# Patient Record
Sex: Female | Born: 1935 | Race: Black or African American | Hispanic: No | Smoking: Never smoker
Health system: Southern US, Community
[De-identification: ages and names within clinical notes are randomized; demographics above are authoritative.]

## PROBLEM LIST (undated history)

## (undated) DIAGNOSIS — H8110 Benign paroxysmal vertigo, unspecified ear: Secondary | ICD-10-CM

## (undated) DIAGNOSIS — R413 Other amnesia: Secondary | ICD-10-CM

## (undated) DIAGNOSIS — F0280 Dementia in other diseases classified elsewhere without behavioral disturbance: Secondary | ICD-10-CM

## (undated) DIAGNOSIS — G309 Alzheimer's disease, unspecified: Secondary | ICD-10-CM

## (undated) DIAGNOSIS — R739 Hyperglycemia, unspecified: Secondary | ICD-10-CM

## (undated) DIAGNOSIS — M1990 Unspecified osteoarthritis, unspecified site: Secondary | ICD-10-CM

## (undated) DIAGNOSIS — K5900 Constipation, unspecified: Secondary | ICD-10-CM

## (undated) DIAGNOSIS — H409 Unspecified glaucoma: Secondary | ICD-10-CM

## (undated) DIAGNOSIS — M159 Polyosteoarthritis, unspecified: Secondary | ICD-10-CM

## (undated) DIAGNOSIS — J309 Allergic rhinitis, unspecified: Secondary | ICD-10-CM

## (undated) HISTORY — DX: Unspecified osteoarthritis, unspecified site: M19.90

## (undated) HISTORY — DX: Dementia in other diseases classified elsewhere without behavioral disturbance: F02.80

## (undated) HISTORY — DX: Polyosteoarthritis, unspecified: M15.9

## (undated) HISTORY — DX: Constipation, unspecified: K59.00

## (undated) HISTORY — DX: Alzheimer's disease, unspecified: G30.9

## (undated) HISTORY — PX: EYE SURGERY: SHX253

## (undated) HISTORY — DX: Other amnesia: R41.3

## (undated) HISTORY — DX: Allergic rhinitis, unspecified: J30.9

## (undated) HISTORY — PX: TONSILECTOMY/ADENOIDECTOMY WITH MYRINGOTOMY: SHX6125

## (undated) HISTORY — DX: Hyperglycemia, unspecified: R73.9

## (undated) HISTORY — DX: Benign paroxysmal vertigo, unspecified ear: H81.10

## (undated) HISTORY — DX: Unspecified glaucoma: H40.9

---

## 1961-04-29 HISTORY — PX: AMPUTATION FINGER / THUMB: SUR24

## 2002-02-14 ENCOUNTER — Emergency Department (HOSPITAL_COMMUNITY): Admission: EM | Admit: 2002-02-14 | Discharge: 2002-02-14 | Payer: Self-pay | Admitting: Emergency Medicine

## 2011-04-19 ENCOUNTER — Ambulatory Visit (INDEPENDENT_AMBULATORY_CARE_PROVIDER_SITE_OTHER): Payer: Medicare Other

## 2011-04-19 DIAGNOSIS — J159 Unspecified bacterial pneumonia: Secondary | ICD-10-CM

## 2011-04-20 ENCOUNTER — Ambulatory Visit (INDEPENDENT_AMBULATORY_CARE_PROVIDER_SITE_OTHER): Payer: Medicare Other

## 2011-04-20 DIAGNOSIS — J157 Pneumonia due to Mycoplasma pneumoniae: Secondary | ICD-10-CM

## 2011-04-25 ENCOUNTER — Ambulatory Visit (INDEPENDENT_AMBULATORY_CARE_PROVIDER_SITE_OTHER): Payer: Medicare Other

## 2011-04-25 DIAGNOSIS — J129 Viral pneumonia, unspecified: Secondary | ICD-10-CM

## 2011-05-17 ENCOUNTER — Ambulatory Visit (INDEPENDENT_AMBULATORY_CARE_PROVIDER_SITE_OTHER): Payer: Medicare Other

## 2011-05-17 DIAGNOSIS — M545 Low back pain, unspecified: Secondary | ICD-10-CM

## 2011-07-16 ENCOUNTER — Ambulatory Visit (INDEPENDENT_AMBULATORY_CARE_PROVIDER_SITE_OTHER): Payer: Medicare Other | Admitting: Family Medicine

## 2011-07-16 VITALS — BP 122/75 | HR 71 | Temp 98.2°F | Resp 16 | Ht 60.0 in | Wt 161.0 lb

## 2011-07-16 DIAGNOSIS — G5600 Carpal tunnel syndrome, unspecified upper limb: Secondary | ICD-10-CM

## 2011-07-16 DIAGNOSIS — M549 Dorsalgia, unspecified: Secondary | ICD-10-CM

## 2011-07-16 MED ORDER — MELOXICAM 7.5 MG PO TABS: 7.5000 mg | ORAL_TABLET | Freq: Every day | ORAL | Status: AC

## 2011-07-16 NOTE — Progress Notes (Signed)
  Patient Name: Kathy Mcclain Date of Birth: Jul 07, 1914 Medical Record Number: [id number] Gender: female Date of Encounter: 07/16/2011  History of Present Illness:  Kathy Mcclain is a 78 y.o. very pleasant female patient who presents with the following:  Complaint of left hip pain for a couple of months.  She has no trauma that she can recall.  She has tried tylenol and icy hot for this.   She also notes numbness in the thumb, index and long fingers of her left hand for about a year.   She was seen for a similar back problem in January and got better with mobic.   She is generally very healthy and has no chronic medications.  She is here today with her son who corroborates her symptoms  There is no problem list on file for this patient.  No past medical history on file. No past surgical history on file. History  Substance Use Topics  . Smoking status: Never Smoker   . Smokeless tobacco: Not on file  . Alcohol Use: Not on file   No family history on file. Allergies  Allergen Reactions  . Penicillins    Medication list has been reviewed and updated.  Review of Systems: As per HPI- otherwise negative.   Physical Examination: Filed Vitals:   07/16/11 1707  BP: 122/75  Pulse: 71  Temp: 98.2 F (36.8 C)  TempSrc: Oral  Resp: 16  Height: 5' (1.524 m)  Weight: 161 lb (73.029 kg)    Body mass index is 31.44 kg/(m^2).  GEN: WDWN, NAD, Non-toxic, A & O x 3, overweight- very lucid and appears younger than age HEENT: Atraumatic, Normocephalic. Neck supple. No masses, No LAD. Ears and Nose: No external deformity. CV: RRR, No M/G/R. No JVD. No thrill. No extra heart sounds. PULM: CTA B, no wheezes, crackles, rhonchi. No retractions. No resp. distress. No accessory muscle use. EXTR: No c/c/e NEURO Normal gait.  PSYCH: Normally interactive. Conversant. Not depressed or anxious appearing.  Calm demeanor.  Back: her "hip" is actually her left lower back- there is no tenderness to  palpation or swelling.  She notes that her left lower back muscles/ buttock can feel sore sometimes.  However, the hip joint itself had good rom and no pain Her hands are both strong and have good ROM.  She does have some degenerative joint disease, and she is missing part of her right ring finger due to an accident many years ago.  She notes a tingling with tapping on her carpal tunnel area   Assessment and Plan: 1. Back pain  meloxicam (MOBIC) 7.5 MG tablet  2. Carpal tunnel syndrome     [name] is having continued left lower back pain- she has had xrays not long ago and her symptoms have not changed.  Likely due to degenerative change. Refilled her meloxican but cautioned her not to use this medication unless she needs it due to the risk of gastric irritation.  She will let us know if she gets worse or if her symptoms change.   I suspect she has CTS.  Try wrist splint for a few weeks at night- let us know how she is doing with this.

## 2012-03-23 DIAGNOSIS — H348322 Tributary (branch) retinal vein occlusion, left eye, stable: Secondary | ICD-10-CM | POA: Insufficient documentation

## 2012-03-23 DIAGNOSIS — H3530 Unspecified macular degeneration: Secondary | ICD-10-CM | POA: Insufficient documentation

## 2012-03-23 DIAGNOSIS — H53002 Unspecified amblyopia, left eye: Secondary | ICD-10-CM | POA: Insufficient documentation

## 2012-03-23 DIAGNOSIS — Z9883 Filtering (vitreous) bleb after glaucoma surgery status: Secondary | ICD-10-CM | POA: Insufficient documentation

## 2012-08-10 ENCOUNTER — Ambulatory Visit (INDEPENDENT_AMBULATORY_CARE_PROVIDER_SITE_OTHER): Payer: Medicare Other | Admitting: Family Medicine

## 2012-08-10 VITALS — BP 136/72 | HR 63 | Temp 97.9°F | Resp 16 | Ht 69.5 in | Wt 156.0 lb

## 2012-08-10 DIAGNOSIS — R200 Anesthesia of skin: Secondary | ICD-10-CM

## 2012-08-10 DIAGNOSIS — R209 Unspecified disturbances of skin sensation: Secondary | ICD-10-CM

## 2012-08-10 DIAGNOSIS — M129 Arthropathy, unspecified: Secondary | ICD-10-CM

## 2012-08-10 DIAGNOSIS — M1990 Unspecified osteoarthritis, unspecified site: Secondary | ICD-10-CM | POA: Insufficient documentation

## 2012-08-10 LAB — POCT GLYCOSYLATED HEMOGLOBIN (HGB A1C): Hemoglobin A1C: 6

## 2012-08-10 LAB — VITAMIN B12: Vitamin B-12: 722 pg/mL (ref 211–911)

## 2012-08-10 LAB — RHEUMATOID FACTOR: Rhuematoid fact SerPl-aCnc: 12 IU/mL (ref <=14)

## 2012-08-10 LAB — POCT SEDIMENTATION RATE: POCT SED RATE: 50 mm/hr — AB (ref 0–22)

## 2012-08-10 NOTE — Progress Notes (Signed)
[facility] [address], [place] [address] [phone]  Date:  08/10/2012   Name:  Kathy Mcclain   DOB:  11/09/1914   MRN:  [id number]  PCP:  No PCP Per Patient    Chief Complaint: other and finger numbness   History of Present Illness:  Kathy Mcclain is a 79 y.o. very pleasant female patient who presents with the following:  Seen by myself about one year ago with left hip pain and numbness in her left thumb, index and long fingers for about one year.   She does not have a PCP but would like to have one.     She notes numbness in both hands and both feet which has been present for about 6 months.  In her feet it comes and goes, but is constant in her hands.   She does note the numbness in both thumbs, index fingers and long fingers.  She is missing the end of her right long finger.   She has glaucoma and uses eye drops for this- her optho is Dr. Veverly Fells in [facility]. She is otherwise generally healthy.   She is seeing a podiatrist next week for her toenails.   There is no problem list on file for this patient.   Past Medical History  Diagnosis Date  . Arthritis   . Glaucoma     Past Surgical History  Procedure Laterality Date  . Eye surgery      History  Substance Use Topics  . Smoking status: Never Smoker   . Smokeless tobacco: Not on file  . Alcohol Use: No    Family History  Problem Relation Age of Onset  . Cancer Mother   . Heart disease Father     Allergies  Allergen Reactions  . Penicillins     Medication list has been reviewed and updated.  No current outpatient prescriptions on file prior to visit.   No current facility-administered medications on file prior to visit.    Review of Systems:  As per HPI- otherwise negative.   Physical Examination: Filed Vitals:   08/10/12 0842  BP: 136/72  Pulse: 63  Temp: 97.9 F (36.6 C)  Resp: 16   Filed Vitals:   08/10/12 0842  Height: 5' 9.5" (1.765 m)  Weight: 156 lb  (70.761 kg)   Body mass index is 22.71 kg/(m^2). Ideal Body Weight: Weight in (lb) to have BMI = 25: 171.4  GEN: WDWN, NAD, Non-toxic, A & O x 3, appears stated age HEENT: Atraumatic, Normocephalic. Neck supple. No masses, No LAD. Ears and Nose: No external deformity. CV: RRR, No M/G/R. No JVD. No thrill. No extra heart sounds. PULM: CTA B, no wheezes, crackles, rhonchi. No retractions. No resp. distress. No accessory muscle use. EXTR: No c/c/e NEURO Normal gait.  PSYCH: Normally interactive. Conversant. Not depressed or anxious appearing.  Calm demeanor.  Hands: good grip strength, ulnar deviation of fingers and severe OA of joints apparent Feet: normal sensation, feet in good repair although second toe of right foot crosses over the great toe.  No skin breakdown  Results for orders placed in visit on 08/10/12  POCT GLYCOSYLATED HEMOGLOBIN (HGB A1C)      Result Value Range   Hemoglobin A1C 6.0    POCT SEDIMENTATION RATE      Result Value Range   POCT SED RATE 50 (*) 0 - 22 mm/hr     Assessment and Plan: Numbness of fingers of  both hands - Plan: POCT glycosylated hemoglobin (Hb A1C), Ambulatory referral to Geriatrics, Folate, Vitamin B12, CANCELED: B12 and Folate Panel  Numbness of feet - Plan: Folate, Vitamin B12, CANCELED: B12 and Folate Panel  Arthritis - Plan: Rheumatoid factor, Cyclic citrul peptide antibody, IgG, C-reactive protein, POCT SEDIMENTATION RATE, Folate, Vitamin B12  Suspect numbness in hands is related to CTS- gave wrist splints to wear at night bilaterally.  Due to ulnar deviaiton will also do labs to rule- out RA.  Check A1c, vitamin B12 and folate due to paraesthesias.   Refer to [facility] for PCP  Will plan further follow- up pending labs.   Signed Abbe Amsterdam, MD

## 2012-08-10 NOTE — Patient Instructions (Addendum)
I will be in touch with the rest of your labs as soon as they come in. Try the wrist splints for your hand numbness.    I am going to refer you to [facility] for a primary doctor- they will be able to perform a Medicare physical exam for you.

## 2012-08-13 ENCOUNTER — Encounter: Payer: Self-pay | Admitting: Family Medicine

## 2012-09-22 ENCOUNTER — Ambulatory Visit (INDEPENDENT_AMBULATORY_CARE_PROVIDER_SITE_OTHER): Payer: Medicare Other | Admitting: Nurse Practitioner

## 2012-09-22 ENCOUNTER — Encounter: Payer: Self-pay | Admitting: Nurse Practitioner

## 2012-09-22 VITALS — BP 154/86 | HR 72 | Resp 18 | Wt 158.0 lb

## 2012-09-22 DIAGNOSIS — R739 Hyperglycemia, unspecified: Secondary | ICD-10-CM

## 2012-09-22 DIAGNOSIS — R7309 Other abnormal glucose: Secondary | ICD-10-CM

## 2012-09-22 DIAGNOSIS — Z789 Other specified health status: Secondary | ICD-10-CM

## 2012-09-22 DIAGNOSIS — H6121 Impacted cerumen, right ear: Secondary | ICD-10-CM

## 2012-09-22 DIAGNOSIS — M129 Arthropathy, unspecified: Secondary | ICD-10-CM

## 2012-09-22 DIAGNOSIS — M1990 Unspecified osteoarthritis, unspecified site: Secondary | ICD-10-CM

## 2012-09-22 DIAGNOSIS — Z9189 Other specified personal risk factors, not elsewhere classified: Secondary | ICD-10-CM

## 2012-09-22 DIAGNOSIS — R413 Other amnesia: Secondary | ICD-10-CM

## 2012-09-22 DIAGNOSIS — H6120 Impacted cerumen, unspecified ear: Secondary | ICD-10-CM

## 2012-09-22 HISTORY — DX: Hyperglycemia, unspecified: R73.9

## 2012-09-22 NOTE — Patient Instructions (Signed)
Come back in morning for fasting labs Make appt in 6 months when you come back from [place] for an EV  Constipation, Adult Constipation is when a person has fewer than 3 bowel movements a week; has difficulty having a bowel movement; or has stools that are dry, hard, or larger than normal. As people grow older, constipation is more common. If you try to fix constipation with medicines that make you have a bowel movement (laxatives), the problem may get worse. Long-term laxative use may cause the muscles of the colon to become weak. A low-fiber diet, not taking in enough fluids, and taking certain medicines may make constipation worse. CAUSES   Certain medicines, such as antidepressants, pain medicine, iron supplements, antacids, and water pills.   Certain diseases, such as diabetes, irritable bowel syndrome (IBS), thyroid disease, or depression.   Not drinking enough water.   Not eating enough fiber-rich foods.   Stress or travel.  Lack of physical activity or exercise.  Not going to the restroom when there is the urge to have a bowel movement.  Ignoring the urge to have a bowel movement.  Using laxatives too much. SYMPTOMS   Having fewer than 3 bowel movements a week.   Straining to have a bowel movement.   Having hard, dry, or larger than normal stools.   Feeling full or bloated.   Pain in the lower abdomen.  Not feeling relief after having a bowel movement. DIAGNOSIS  Your caregiver will take a medical history and perform a physical exam. Further testing may be done for severe constipation. Some tests may include:   A barium enema X-ray to examine your rectum, colon, and sometimes, your small intestine.  A sigmoidoscopy to examine your lower colon.  A colonoscopy to examine your entire colon. TREATMENT  Treatment will depend on the severity of your constipation and what is causing it. Some dietary treatments include drinking more fluids and eating more  fiber-rich foods. Lifestyle treatments may include regular exercise. If these diet and lifestyle recommendations do not help, your caregiver may recommend taking over-the-counter laxative medicines to help you have bowel movements. Prescription medicines may be prescribed if over-the-counter medicines do not work.  HOME CARE INSTRUCTIONS   Increase dietary fiber in your diet, such as fruits, vegetables, whole grains, and beans. Limit high-fat and processed sugars in your diet, such as [place] fries, hamburgers, cookies, candies, and soda.   A fiber supplement may be added to your diet if you cannot get enough fiber from foods.   Drink enough fluids to keep your urine clear or pale yellow.   Exercise regularly or as directed by your caregiver.   Go to the restroom when you have the urge to go. Do not hold it.  Only take medicines as directed by your caregiver. Do not take other medicines for constipation without talking to your caregiver first. SEEK IMMEDIATE MEDICAL CARE IF:   You have bright red blood in your stool.   Your constipation lasts for more than 4 days or gets worse.   You have abdominal or rectal pain.   You have thin, pencil-like stools.  You have unexplained weight loss. MAKE SURE YOU:   Understand these instructions.  Will watch your condition.  Will get help right away if you are not doing well or get worse. Document Released: 01/12/2004 Document Revised: 07/08/2011 Document Reviewed: 03/19/2011 [facility] Patient Information 2014 [place], [place].    Cardiac Diet This diet can help prevent heart disease and  stroke. Many factors influence your heart health, including eating and exercise habits. Coronary risk rises a lot with abnormal blood fat (lipid) levels. Cardiac meal planning includes limiting unhealthy fats, increasing healthy fats, and making other small dietary changes. General guidelines are as follows:  Adjust calorie intake to reach and maintain  desirable body weight.  Limit total fat intake to less than 30% of total calories. Saturated fat should be less than 7% of calories.  Saturated fats are found in animal products and in some vegetable products. Saturated vegetable fats are found in coconut oil, cocoa butter, palm oil, and palm kernel oil. Read labels carefully to avoid these products as much as possible. Use butter in moderation. Choose tub margarines and oils that have 2 grams of fat or less. Good cooking oils are canola and olive oils.  Practice low-fat cooking techniques. Do not fry food. Instead, broil, bake, boil, steam, grill, roast on a rack, stir-fry, or microwave it. Other fat reducing suggestions include:  Remove the skin from poultry.  Remove all visible fat from meats.  Skim the fat off stews, soups, and gravies before serving them.  Steam vegetables in water or broth instead of sauting them in fat.  Avoid foods with trans fat (or hydrogenated oils), such as commercially fried foods and commercially baked goods. Commercial shortening and deep-frying fats will contain trans fat.  Increase intake of fruits, vegetables, whole grains, and legumes to replace foods high in fat.  Increase consumption of nuts, legumes, and seeds to at least 4 servings weekly. One serving of a legume equals  cup, and 1 serving of nuts or seeds equals  cup.  Choose whole grains more often. Have 3 servings per day (a serving is 1 ounce [oz]).  Eat 4 to 5 servings of vegetables per day. A serving of vegetables is 1 cup of raw leafy vegetables;  cup of raw or cooked cut-up vegetables;  cup of vegetable juice.  Eat 4 to 5 servings of fruit per day. A serving of fruit is 1 medium whole fruit;  cup of dried fruit;  cup of fresh, frozen, or canned fruit;  cup of 100% fruit juice.  Increase your intake of dietary fiber to 20 to 30 grams per day. Insoluble fiber may help lower your risk of heart disease and may help curb your appetite.   Soluble fiber binds cholesterol to be removed from the blood. Foods high in soluble fiber are dried beans, citrus fruits, oats, apples, bananas, broccoli, Brussels sprouts, and eggplant.  Try to include foods fortified with plant sterols or stanols, such as yogurt, breads, juices, or margarines. Choose several fortified foods to achieve a daily intake of 2 to 3 grams of plant sterols or stanols.  Foods with omega-3 fats can help reduce your risk of heart disease. Aim to have a 3.5 oz portion of fatty fish twice per week, such as salmon, mackerel, albacore tuna, sardines, lake trout, or herring. If you wish to take a fish oil supplement, choose one that contains 1 gram of both DHA and EPA.  Limit processed meats to 2 servings (3 oz portion) weekly.  Limit the sodium in your diet to 1500 milligrams (mg) per day. If you have high blood pressure, talk to a registered dietitian about a DASH (Dietary Approaches to Stop Hypertension) eating plan.  Limit sweets and beverages with added sugar, such as soda, to no more than 5 servings per week. One serving is:   1 tablespoon sugar.  1  tablespoon jelly or jam.   cup sorbet.  1 cup lemonade.   cup regular soda. CHOOSING FOODS Starches  Allowed: Breads: All kinds (wheat, rye, raisin, white, oatmeal, [place], [place], and English muffin bread). Low-fat rolls: English muffins, frankfurter and hamburger buns, bagels, pita bread, tortillas (not fried). Pancakes, waffles, biscuits, and muffins made with recommended oil.  Avoid: Products made with saturated or trans fats, oils, or whole milk products. Butter rolls, cheese breads, croissants. Commercial doughnuts, muffins, sweet rolls, biscuits, waffles, pancakes, store-bought mixes. Crackers  Allowed: Low-fat crackers and snacks: Animal, graham, rye, saltine (with recommended oil, no lard), oyster, and matzo crackers. Bread sticks, melba toast, rusks, flatbread, pretzels, and light popcorn.  Avoid:  High-fat crackers: cheese crackers, butter crackers, and those made with coconut, palm oil, or trans fat (hydrogenated oils). Buttered popcorn. Cereals  Allowed: Hot or cold whole-grain cereals.  Avoid: Cereals containing coconut, hydrogenated vegetable fat, or animal fat. Potatoes / Pasta / Rice  Allowed: All kinds of potatoes, rice, and pasta (such as macaroni, spaghetti, and noodles).  Avoid: Pasta or rice prepared with cream sauce or high-fat cheese. Chow mein noodles, [place] fries. Vegetables  Allowed: All vegetables and vegetable juices.  Avoid: Fried vegetables. Vegetables in cream, butter, or high-fat cheese sauces. Limit coconut. Fruit in cream or custard. Protein  Allowed: Limit your intake of meat, seafood, and poultry to no more than 6 oz (cooked weight) per day. All lean, well-trimmed beef, veal, pork, and lamb. All chicken and [place] without skin. All fish and shellfish. Wild game: wild duck, rabbit, pheasant, and venison. Egg whites or low-cholesterol egg substitutes may be used as desired. Meatless dishes: recipes with dried beans, peas, lentils, and tofu (soybean curd). Seeds and nuts: all seeds and most nuts.  Avoid: Prime grade and other heavily marbled and fatty meats, such as short ribs, spare ribs, rib eye roast or steak, frankfurters, sausage, bacon, and high-fat luncheon meats, mutton. Caviar. Commercially fried fish. Domestic duck, goose, venison sausage. Organ meats: liver, gizzard, heart, chitterlings, brains, kidney, sweetbreads. Dairy  Allowed: Low-fat cheeses: nonfat or low-fat cottage cheese (1% or 2% fat), cheeses made with part skim milk, such as mozzarella, farmers, string, or ricotta. (Cheeses should be labeled no more than 2 to 6 grams fat per oz.). Skim (or 1%) milk: liquid, powdered, or evaporated. Buttermilk made with low-fat milk. Drinks made with skim or low-fat milk or cocoa. Chocolate milk or cocoa made with skim or low-fat (1%) milk. Nonfat or  low-fat yogurt.  Avoid: Whole milk cheeses, including colby, cheddar, [place], [address], [place], [place], [place], [address], [place], and blue. Creamed cottage cheese, cream cheese. Whole milk and whole milk products, including buttermilk or yogurt made from whole milk, drinks made from whole milk. Condensed milk, evaporated whole milk, and 2% milk. Soups and Combination Foods  Allowed: Low-fat low-sodium soups: broth, dehydrated soups, homemade broth, soups with the fat removed, homemade cream soups made with skim or low-fat milk. Low-fat spaghetti, lasagna, chili, and Spanish rice if low-fat ingredients and low-fat cooking techniques are used.  Avoid: Cream soups made with whole milk, cream, or high-fat cheese. All other soups. Desserts and Sweets  Allowed: Sherbet, fruit ices, gelatins, meringues, and angel food cake. Homemade desserts with recommended fats, oils, and milk products. Jam, jelly, honey, marmalade, sugars, and syrups. Pure sugar candy, such as gum drops, hard candy, jelly beans, marshmallows, mints, and small amounts of dark chocolate.  Avoid: Commercially prepared cakes, pies, cookies, frosting, pudding, or mixes for these  products. Desserts containing whole milk products, chocolate, coconut, lard, palm oil, or palm kernel oil. Ice cream or ice cream drinks. Candy that contains chocolate, coconut, butter, hydrogenated fat, or unknown ingredients. Buttered syrups. Fats and Oils  Allowed: Vegetable oils: safflower, sunflower, corn, soybean, cottonseed, sesame, canola, olive, or peanut. Non-hydrogenated margarines. Salad dressing or mayonnaise: homemade or commercial, made with a recommended oil. Low or nonfat salad dressing or mayonnaise.  Limit added fats and oils to 6 to 8 tsp per day (includes fats used in cooking, baking, salads, and spreads on bread). Remember to count the "hidden fats" in foods.  Avoid: Solid fats and shortenings: butter, lard, salt pork, bacon drippings.  Gravy containing meat fat, shortening, or suet. Cocoa butter, coconut. Coconut oil, palm oil, palm kernel oil, or hydrogenated oils: these ingredients are often used in bakery products, nondairy creamers, whipped toppings, candy, and commercially fried foods. Read labels carefully. Salad dressings made of unknown oils, sour cream, or cheese, such as blue cheese and Roquefort. Cream, all kinds: half-and-half, light, heavy, or whipping. Sour cream or cream cheese (even if "light" or low-fat). Nondairy cream substitutes: coffee creamers and sour cream substitutes made with palm, palm kernel, hydrogenated oils, or coconut oil. Beverages  Allowed: Coffee (regular or decaffeinated), tea. Diet carbonated beverages, mineral water. Alcohol: Check with your caregiver. Moderation is recommended.  Avoid: Whole milk, regular sodas, and juice drinks with added sugar. Condiments  Allowed: All seasonings and condiments. Cocoa powder. "Cream" sauces made with recommended ingredients.  Avoid: Carob powder made with hydrogenated fats. SAMPLE MENU Breakfast   cup orange juice   cup oatmeal  1 slice toast  1 tsp margarine  1 cup skim milk Lunch  [place] sandwich with 2 oz [place], 2 slices bread  Lettuce and tomato slices  Fresh fruit  Carrot sticks  Coffee or tea Snack  Fresh fruit or low-fat crackers Dinner  3 oz lean ground beef  1 baked potato  1 tsp margarine   cup asparagus  Lettuce salad  1 tbs non-creamy dressing   cup peach slices  1 cup skim milk Document Released: 01/23/2008 Document Revised: 10/15/2011 Document Reviewed: 07/09/2011 [employer] Patient Information 2014 [place], [place].

## 2012-09-22 NOTE — Progress Notes (Signed)
Failed clock drawing,

## 2012-09-22 NOTE — Assessment & Plan Note (Signed)
Pt reports she is not in much pain- only needs tylenol once daily- to Cont with as needed tylenol

## 2012-09-22 NOTE — Progress Notes (Signed)
Patient ID: Kathy Mcclain, female   DOB: [birth year], 79 y.o.   MRN: [id number]   Allergies  Allergen Reactions  . Penicillins Hives and Itching    Chief Complaint  Patient presents with  . new patient to establish care    HPI: Patient is a 79 y.o. female seen in the office today to establish care. Pt lives here in [place] for 6 month with her son and in [place] with her daughter for 6 month.  She is leaving for [place] next week. Pt reports she has 2 main problems-- she is hard of hearing which she wears hearing aids and glucamoma in which she has had surgery.   Reports numbness and tingling in the first 2 fingers and thumbs of both hands and in both great toes, which do not cause or pain or discomfort she is just aware of this.  Sees podiatry for her toenails   Review of Systems:  Review of Systems  Constitutional: Negative for fever, chills and weight loss.  HENT: Positive for hearing loss and tinnitus. Negative for ear pain, congestion and sore throat.   Eyes: Positive for blurred vision (uses drops for glaucoma).  Respiratory: Negative for cough and shortness of breath.   Cardiovascular: Negative for chest pain, palpitations and leg swelling.  Gastrointestinal: Positive for constipation (at times). Negative for heartburn (will have at some times takes OTC medication and it goes away), abdominal pain and diarrhea.  Genitourinary: Positive for urgency. Negative for dysuria and frequency.       Incontinences at times due to urgency  Musculoskeletal: Positive for myalgias and joint pain (low back pain and bilateral knee).       Walks with a cane to help steady herself  Skin: Negative for itching and rash.  Neurological: Positive for tingling and headaches. Negative for dizziness and weakness (occasonally).  Psychiatric/Behavioral: Positive for memory loss (minimal per daugher). Negative for depression. The patient is not nervous/anxious and does not have insomnia.      Past  Medical History  Diagnosis Date  . Arthritis   . Glaucoma    Past Surgical History  Procedure Laterality Date  . Tonsilectomy/adenoidectomy with myringotomy      as child  . Amputation finger / thumb Right 1963    index  . Eye surgery Bilateral     catracts   Social History:   reports that she has never smoked. She does not have any smokeless tobacco history on file. She reports that she does not drink alcohol or use illicit drugs.  Family History  Problem Relation Age of Onset  . Cancer Mother   . Heart disease Father   . Heart disease Brother   . Heart disease Brother   . Heart disease Brother     Medications: Patient's Medications  New Prescriptions   No medications on file  Previous Medications   ACETAMINOPHEN (TYLENOL) 500 MG TABLET    Take 500 mg by mouth once. Take one tab every day.   APRACLONIDINE (IOPIDINE) 0.5 % OPHTHALMIC SOLUTION    1 drop 2 (two) times daily.   DORZOLAMIDE (TRUSOPT) 2 % OPHTHALMIC SOLUTION    1 drop.   GINKGO BILOBA PO    Take by mouth daily.   LATANOPROST (XALATAN) 0.005 % OPHTHALMIC SOLUTION    1 drop at bedtime.   MULTIPLE VITAMINS-MINERALS (CENTRUM SILVER PO)    Take by mouth daily.   OMEGA-3 FATTY ACIDS (FISH OIL PO)    Take by mouth.  Modified Medications   No medications on file  Discontinued Medications   No medications on file     Physical Exam:  Filed Vitals:   09/22/12 1314  BP: 154/86  Pulse: 72  Resp: 18  Weight: 158 lb (71.668 kg)    Physical Exam  Constitutional: She is well-developed, well-nourished, and in no distress. No distress.  HENT:  Head: Normocephalic and atraumatic.  Right Ear: External ear normal.  Left Ear: External ear normal.  Nose: Nose normal.  Mouth/Throat: Oropharynx is clear and moist. No oropharyngeal exudate.  Eyes: Conjunctivae and EOM are normal. Pupils are equal, round, and reactive to light.  Neck: Normal range of motion. Neck supple. No JVD present. No tracheal deviation present.  No thyromegaly present.  Cardiovascular: Normal rate, regular rhythm, normal heart sounds and intact distal pulses.   Pulmonary/Chest: Effort normal and breath sounds normal. No respiratory distress.  Abdominal: Soft. Bowel sounds are normal. She exhibits no distension. There is no tenderness.  Musculoskeletal: Normal range of motion. She exhibits no edema and no tenderness.  Neurological: She is alert.  Skin: Skin is warm and dry. She is not diaphoretic. No erythema.  Psychiatric: Affect normal.       Assessment/Plan Hyperglycemia Pt with A1c of 6.0- will cont to follow blood sugars; encouraged heart healthy low carb diet.   Arthritis Pt reports she is not in much pain- only needs tylenol once daily- to Cont with as needed tylenol  Cerumen impaction, right 380.4  Ear wash done- pt tolerated well  At risk for bone density loss V49.89 Will order dexa  Memory loss 780.93 Will get lab work- if labs WNL discussed with daughter about starting medication to preserve memory- she is concerned because she can she doesn't remember things like she once did.   Labs/tests ordered Fasting lipids, cmp, cbc, tsh To follow up when she returns from [place] for EV

## 2012-09-22 NOTE — Assessment & Plan Note (Signed)
Pt with A1c of 6.0- will cont to follow blood sugars; encouraged heart healthy low carb diet.

## 2012-09-23 ENCOUNTER — Ambulatory Visit
Admission: RE | Admit: 2012-09-23 | Discharge: 2012-09-23 | Disposition: A | Discharge status: Home / Self Care | Payer: Self-pay | Source: Ambulatory Visit | Attending: Nurse Practitioner | Admitting: Nurse Practitioner

## 2012-09-23 ENCOUNTER — Other Ambulatory Visit: Payer: Medicare Other

## 2012-09-24 LAB — LIPID PANEL
LDL Calculated: 144 mg/dL — ABNORMAL HIGH (ref 0–99)
VLDL Cholesterol Cal: 14 mg/dL (ref 5–40)

## 2012-09-24 LAB — COMPREHENSIVE METABOLIC PANEL
ALT: 12 IU/L (ref 0–32)
Albumin/Globulin Ratio: 1.4 (ref 1.1–2.5)
BUN: 17 mg/dL (ref 10–36)
Calcium: 9.3 mg/dL (ref 8.6–10.2)
Creatinine, Ser: 1.07 mg/dL — ABNORMAL HIGH (ref 0.57–1.00)
GFR calc non Af Amer: 44 mL/min/{1.73_m2} — ABNORMAL LOW (ref >59)
Globulin, Total: 2.7 g/dL (ref 1.5–4.5)
Glucose: 134 mg/dL — ABNORMAL HIGH (ref 65–99)
Total Protein: 6.6 g/dL (ref 6.0–8.5)

## 2012-09-24 LAB — CBC WITH DIFFERENTIAL/PLATELET
Basos: 0 % (ref 0–3)
Eos: 3 % (ref 0–5)
Immature Grans (Abs): 0 10*3/uL (ref 0.0–0.1)
Lymphs: 28 % (ref 14–46)
Neutrophils Relative %: 60 % (ref 40–74)
RBC: 3.56 x10E6/uL — ABNORMAL LOW (ref 3.77–5.28)
WBC: 7.1 10*3/uL (ref 3.4–10.8)

## 2012-09-24 LAB — TSH: TSH: 1.68 u[IU]/mL (ref 0.450–4.500)

## 2012-10-05 ENCOUNTER — Ambulatory Visit: Payer: Medicare Other | Admitting: Nurse Practitioner

## 2012-10-06 ENCOUNTER — Ambulatory Visit: Payer: Medicare Other | Admitting: Nurse Practitioner

## 2012-10-14 ENCOUNTER — Ambulatory Visit: Payer: Medicare Other | Admitting: Nurse Practitioner

## 2013-03-11 ENCOUNTER — Encounter: Payer: Medicare Other | Admitting: Nurse Practitioner

## 2013-03-30 ENCOUNTER — Encounter: Payer: Self-pay | Admitting: Nurse Practitioner

## 2013-03-31 ENCOUNTER — Ambulatory Visit (INDEPENDENT_AMBULATORY_CARE_PROVIDER_SITE_OTHER): Payer: Medicare Other | Admitting: Nurse Practitioner

## 2013-03-31 ENCOUNTER — Encounter: Payer: Self-pay | Admitting: Nurse Practitioner

## 2013-03-31 VITALS — BP 116/70 | HR 75 | Temp 97.6°F | Resp 14 | Ht 60.75 in | Wt 149.2 lb

## 2013-03-31 DIAGNOSIS — M129 Arthropathy, unspecified: Secondary | ICD-10-CM

## 2013-03-31 DIAGNOSIS — E785 Hyperlipidemia, unspecified: Secondary | ICD-10-CM

## 2013-03-31 DIAGNOSIS — L738 Other specified follicular disorders: Secondary | ICD-10-CM

## 2013-03-31 DIAGNOSIS — K5900 Constipation, unspecified: Secondary | ICD-10-CM

## 2013-03-31 DIAGNOSIS — L853 Xerosis cutis: Secondary | ICD-10-CM

## 2013-03-31 DIAGNOSIS — M1990 Unspecified osteoarthritis, unspecified site: Secondary | ICD-10-CM

## 2013-03-31 DIAGNOSIS — R413 Other amnesia: Secondary | ICD-10-CM

## 2013-03-31 HISTORY — DX: Constipation, unspecified: K59.00

## 2013-03-31 HISTORY — DX: Other amnesia: R41.3

## 2013-03-31 MED ORDER — DONEPEZIL HCL 10 MG PO TABS: ORAL_TABLET | ORAL | Status: DC

## 2013-03-31 NOTE — Progress Notes (Signed)
Patient ID: Kathy Mcclain, female   DOB: 1914/07/27, 79 y.o.   MRN: [id number]    Allergies  Allergen Reactions  . Penicillins Hives and Itching    Chief Complaint  Patient presents with  . Annual Exam    physical with no labs prior.  . Immunizations    will get flu vaccine, not sure about Pnuemo & Tdap  . Breast Problem    itching arround the nipple    HPI: Patient is a 79 y.o. female seen in the office today for Extended visit; has just come home from 6 months from being with her daughter in [place]  -pt reports itching from nipple, right; she tried not to scratch it, uses icy hot which helps -otherwise no complaints  Review of Systems:  Review of Systems  Constitutional: Negative for fever, chills and weight loss.  HENT: Positive for hearing loss (has hearing aids). Negative for congestion, ear pain, sore throat and tinnitus.   Eyes: Positive for blurred vision (uses drops for glaucoma).  Respiratory: Negative for cough and shortness of breath.   Cardiovascular: Negative for chest pain, palpitations and leg swelling.  Gastrointestinal: Positive for constipation (at times having to strain and not on medication). Negative for heartburn (will have at some times takes OTC medication and it goes away), abdominal pain and diarrhea.  Genitourinary: Positive for urgency. Negative for dysuria and frequency.       Incontinences at times due to urgency  Musculoskeletal: Positive for joint pain (low back pain and bilateral knee) and myalgias.       Walks with a cane to help steady herself  Skin: Negative for itching and rash.  Neurological: Negative for dizziness, weakness (occasonally) and headaches. Tingling: fingers on 2 first fingers are numb.  Psychiatric/Behavioral: Positive for memory loss. Negative for depression. The patient is not nervous/anxious and does not have insomnia.        Son reports she obsesses over things like her dry skin, constipation, etc; tries to find things  wrong that arent      Past Medical History  Diagnosis Date  . Arthritis   . Glaucoma    Past Surgical History  Procedure Laterality Date  . Tonsilectomy/adenoidectomy with myringotomy      as child  . Amputation finger / thumb Right 1963    index  . Eye surgery Bilateral     catracts   Social History:   reports that she has never smoked. She does not have any smokeless tobacco history on file. She reports that she does not drink alcohol or use illicit drugs.  Family History  Problem Relation Age of Onset  . Cancer Mother   . Heart disease Father   . Heart disease Brother   . Heart disease Brother   . Heart disease Brother     Medications: Patient's Medications  New Prescriptions   No medications on file  Previous Medications   ACETAMINOPHEN (TYLENOL) 500 MG TABLET    Take 500 mg by mouth once. Take one tab every day.   APRACLONIDINE (IOPIDINE) 0.5 % OPHTHALMIC SOLUTION    1 drop 2 (two) times daily.   DORZOLAMIDE (TRUSOPT) 2 % OPHTHALMIC SOLUTION    1 drop.   GINKGO BILOBA PO    Take by mouth daily.   LATANOPROST (XALATAN) 0.005 % OPHTHALMIC SOLUTION    1 drop at bedtime.   MULTIPLE VITAMINS-MINERALS (CENTRUM SILVER PO)    Take by mouth daily.   NON FORMULARY  Cough Syrup: pro methazine-DM every 4-6 hours PRN for cough   OMEGA-3 FATTY ACIDS (FISH OIL PO)    Take by mouth.  Modified Medications   No medications on file  Discontinued Medications   No medications on file     Physical Exam:  Filed Vitals:   03/31/13 1119  BP: 116/70  Pulse: 75  Temp: 97.6 F (36.4 C)  TempSrc: Oral  Resp: 14  Height: 5' 0.75" (1.543 m)  Weight: 149 lb 3.2 oz (67.677 kg)  SpO2: 97%    Physical Exam  Constitutional: She is well-developed, well-nourished, and in no distress. No distress.  HENT:  Head: Normocephalic and atraumatic.  Right Ear: External ear normal.  Left Ear: External ear normal.  Nose: Nose normal.  Mouth/Throat: Oropharynx is clear and moist. No  oropharyngeal exudate.  Eyes: Conjunctivae and EOM are normal. Pupils are equal, round, and reactive to light.  Neck: Normal range of motion. Neck supple. No JVD present. No tracheal deviation present. No thyromegaly present.  Cardiovascular: Normal rate, regular rhythm, normal heart sounds and intact distal pulses.   Pulmonary/Chest: Effort normal and breath sounds normal. No respiratory distress.  Normal breast exam  Abdominal: Soft. Bowel sounds are normal. She exhibits no distension. There is no tenderness.  Genitourinary: Guaiac negative stool.  Musculoskeletal: Normal range of motion. She exhibits no edema and no tenderness.  Neurological: She is alert. She displays normal reflexes. Gait normal.  Skin: Skin is warm and dry. She is not diaphoretic. No erythema.  Psychiatric: Affect normal.     Labs reviewed: Basic Metabolic Panel:  Recent Labs  40/98/11 0816  NA 146*  K 5.1  CL 108  CO2 24  GLUCOSE 134*  BUN 17  CREATININE 1.07*  CALCIUM 9.3  TSH 1.680   Liver Function Tests:  Recent Labs  09/23/12 0816  AST 17  ALT 12  ALKPHOS 55  BILITOT 0.4  PROT 6.6   No results found for this basename: LIPASE, AMYLASE,  in the last 8760 hours No results found for this basename: AMMONIA,  in the last 8760 hours CBC:  Recent Labs  09/23/12 0816  WBC 7.1  NEUTROABS 4.3  HGB 11.5  HCT 35.1  MCV 99*   Lipid Panel:  Recent Labs  09/23/12 0816  HDL 63  LDLCALC 144*  TRIG 72  CHOLHDL 3.5   TSH:  Recent Labs  09/23/12 0816  TSH 1.680   A1C: No components found with this basename: A1C,    Assessment/Plan 1. Arthritis -to cont tylenol 650 mg q 6 hours as needed  2. Memory loss -MMSE shows mild memory impairment with a score of 23/30 -will start aricept 5 mg q hs at this time; after 4 weeks may increase to 10 mg qhs To preserve memory   3. Unspecified constipation -occasional constipation -information given regarding prevention of constipation  4.  Other and unspecified hyperlipidemia -discussed cholesterol with son and pt -due to pts age and need for longevity of statin will not start medication  -pt to limit food high in cholesterol (pt uses a lot of butter)  5. Dry skin -to use Eucerin lotion as needed for dryness and itching -increase water intake  To follow up in 3 month with [facility] for Routine follow up

## 2013-03-31 NOTE — Patient Instructions (Signed)
To follow up with RE[facility] in 3 month for routine follow up   -Increase water intake this will help with constipation and dry skin -to use Eucerin lotion as needed to dry spots  - low cholesterol diet -will start medication to preserve memory at this time  Constipation, Adult Constipation is when a person has fewer than 3 bowel movements a week; has difficulty having a bowel movement; or has stools that are dry, hard, or larger than normal. As people grow older, constipation is more common. If you try to fix constipation with medicines that make you have a bowel movement (laxatives), the problem may get worse. Long-term laxative use may cause the muscles of the colon to become weak. A low-fiber diet, not taking in enough fluids, and taking certain medicines may make constipation worse. CAUSES   Certain medicines, such as antidepressants, pain medicine, iron supplements, antacids, and water pills.   Certain diseases, such as diabetes, irritable bowel syndrome (IBS), thyroid disease, or depression.   Not drinking enough water.   Not eating enough fiber-rich foods.   Stress or travel.  Lack of physical activity or exercise.  Not going to the restroom when there is the urge to have a bowel movement.  Ignoring the urge to have a bowel movement.  Using laxatives too much. SYMPTOMS   Having fewer than 3 bowel movements a week.   Straining to have a bowel movement.   Having hard, dry, or larger than normal stools.   Feeling full or bloated.   Pain in the lower abdomen.  Not feeling relief after having a bowel movement. DIAGNOSIS  Your caregiver will take a medical history and perform a physical exam. Further testing may be done for severe constipation. Some tests may include:   A barium enema X-ray to examine your rectum, colon, and sometimes, your small intestine.  A sigmoidoscopy to examine your lower colon.  A colonoscopy to examine your entire colon. TREATMENT   Treatment will depend on the severity of your constipation and what is causing it. Some dietary treatments include drinking more fluids and eating more fiber-rich foods. Lifestyle treatments may include regular exercise. If these diet and lifestyle recommendations do not help, your caregiver may recommend taking over-the-counter laxative medicines to help you have bowel movements. Prescription medicines may be prescribed if over-the-counter medicines do not work.  HOME CARE INSTRUCTIONS   Increase dietary fiber in your diet, such as fruits, vegetables, whole grains, and beans. Limit high-fat and processed sugars in your diet, such as [place] fries, hamburgers, cookies, candies, and soda.   A fiber supplement may be added to your diet if you cannot get enough fiber from foods.   Drink enough fluids to keep your urine clear or pale yellow.   Exercise regularly or as directed by your caregiver.   Go to the restroom when you have the urge to go. Do not hold it.  Only take medicines as directed by your caregiver. Do not take other medicines for constipation without talking to your caregiver first. SEEK IMMEDIATE MEDICAL CARE IF:   You have bright red blood in your stool.   Your constipation lasts for more than 4 days or gets worse.   You have abdominal or rectal pain.   You have thin, pencil-like stools.  You have unexplained weight loss. MAKE SURE YOU:   Understand these instructions.  Will watch your condition.  Will get help right away if you are not doing well or get worse. Document Released:  01/12/2004 Document Revised: 07/08/2011 Document Reviewed: 03/19/2011 [employer] Patient Information 2014 [place], [place].

## 2013-04-14 ENCOUNTER — Ambulatory Visit (INDEPENDENT_AMBULATORY_CARE_PROVIDER_SITE_OTHER): Payer: Medicare Other | Admitting: Nurse Practitioner

## 2013-04-14 ENCOUNTER — Encounter: Payer: Self-pay | Admitting: Nurse Practitioner

## 2013-04-14 VITALS — BP 134/78 | HR 73 | Temp 97.8°F | Resp 12 | Wt 150.0 lb

## 2013-04-14 DIAGNOSIS — K5900 Constipation, unspecified: Secondary | ICD-10-CM

## 2013-04-14 DIAGNOSIS — R413 Other amnesia: Secondary | ICD-10-CM

## 2013-04-14 DIAGNOSIS — N644 Mastodynia: Secondary | ICD-10-CM

## 2013-04-14 NOTE — Patient Instructions (Signed)
-  Take aricept in the morning to avoid bad dreams - colace 100 mg for constipation -breast exam was normal

## 2013-04-14 NOTE — Progress Notes (Signed)
Patient ID: Kathy Mcclain, female   DOB: 1915/01/14, 79 y.o.   MRN: [id number]    Allergies  Allergen Reactions  . Penicillins Hives and Itching    Chief Complaint  Patient presents with  . Breast Pain    Right side breast pain x 2 episodes, last episode was 1 week ago. Pain was quick and sharpe     HPI: Patient is a 79 y.o.  Female seen in the office today for follow up on breast pain Pt reports 2 episodes of quick sharp pains lasting about 2 seconds, no soreness, no drainage, no bumps. Breast are non-tender now; does not get mammograms wanted this checked Daughter in law with pt at visit; gets obsessed with bowels Will not take her aricept due to bad dreams at night  Review of Systems:  Review of Systems  Constitutional: Negative for fever, chills and malaise/fatigue.  Respiratory: Negative for shortness of breath.   Cardiovascular: Negative for chest pain and leg swelling.  Gastrointestinal: Positive for constipation. Negative for abdominal pain and diarrhea.  Genitourinary: Negative for dysuria.  Musculoskeletal:       See hpi on breast pain  Skin: Negative for itching and rash.  Neurological: Negative for weakness.  Psychiatric/Behavioral: Positive for memory loss.     Past Medical History  Diagnosis Date  . Arthritis   . Glaucoma    Past Surgical History  Procedure Laterality Date  . Tonsilectomy/adenoidectomy with myringotomy      as child  . Amputation finger / thumb Right 1963    index  . Eye surgery Bilateral     catracts   Social History:   reports that she has never smoked. She does not have any smokeless tobacco history on file. She reports that she does not drink alcohol or use illicit drugs.  Family History  Problem Relation Age of Onset  . Cancer Mother   . Heart disease Father   . Heart disease Brother   . Heart disease Brother   . Heart disease Brother     Medications: Patient's Medications  New Prescriptions   No medications on file    Previous Medications   ACETAMINOPHEN (TYLENOL) 500 MG TABLET    Take 500 mg by mouth once. Take one tab every day.   APRACLONIDINE (IOPIDINE) 0.5 % OPHTHALMIC SOLUTION    1 drop 2 (two) times daily.   DONEPEZIL (ARICEPT) 10 MG TABLET    Take 1/2 tablet every night for 4 weeks then increase to 1 tablet nightly for memory   DORZOLAMIDE (TRUSOPT) 2 % OPHTHALMIC SOLUTION    1 drop.   GINKGO BILOBA PO    Take by mouth daily.   LATANOPROST (XALATAN) 0.005 % OPHTHALMIC SOLUTION    1 drop at bedtime.   MULTIPLE VITAMINS-MINERALS (CENTRUM SILVER PO)    Take by mouth daily.   NON FORMULARY    Cough Syrup: pro methazine-DM every 4-6 hours PRN for cough   OMEGA-3 FATTY ACIDS (FISH OIL PO)    Take by mouth.  Modified Medications   No medications on file  Discontinued Medications   No medications on file     Physical Exam:  Filed Vitals:   04/14/13 1420  BP: 134/78  Pulse: 73  Temp: 97.8 F (36.6 C)  TempSrc: Oral  Resp: 12  Weight: 150 lb (68.04 kg)  SpO2: 95%    Physical Exam  Constitutional: She is well-developed, well-nourished, and in no distress. No distress.  Eyes: Conjunctivae and EOM are  normal. Pupils are equal, round, and reactive to light.  Neck: Normal range of motion. Neck supple. No JVD present. No tracheal deviation present. No thyromegaly present.  Cardiovascular: Normal rate, regular rhythm and normal heart sounds.   Pulmonary/Chest: Effort normal and breath sounds normal. No respiratory distress.  Normal breast exam  Abdominal: Soft. Bowel sounds are normal. She exhibits no distension. There is no tenderness.  Musculoskeletal: Normal range of motion. She exhibits no edema and no tenderness.  Neurological: She is alert. She displays normal reflexes. Gait normal.  Skin: Skin is warm and dry. She is not diaphoretic. No erythema.  Psychiatric: Affect normal.     Labs reviewed: Basic Metabolic Panel:  Recent Labs  96/04/54 0816  NA 146*  K 5.1  CL 108  CO2 24   GLUCOSE 134*  BUN 17  CREATININE 1.07*  CALCIUM 9.3  TSH 1.680   Liver Function Tests:  Recent Labs  09/23/12 0816  AST 17  ALT 12  ALKPHOS 55  BILITOT 0.4  PROT 6.6   No results found for this basename: LIPASE, AMYLASE,  in the last 8760 hours No results found for this basename: AMMONIA,  in the last 8760 hours CBC:  Recent Labs  09/23/12 0816  WBC 7.1  NEUTROABS 4.3  HGB 11.5  HCT 35.1  MCV 99*   Lipid Panel:  Recent Labs  09/23/12 0816  HDL 63  LDLCALC 144*  TRIG 72  CHOLHDL 3.5   TSH:  Recent Labs  09/23/12 0816  TSH 1.680   A1C Lab Results  Component Value Date   HGBA1C 6.0 08/10/2012     Assessment/Plan 1. Unspecified constipation -family report pt frequently reports she has to strain but has BMs daily -will have family provide toileting schedule -colace 100 mg daily  2. Breast pain -will cont to monitor; no intervention needed at this time  3. Memory loss -not taking aricept due to bad dreams -will have pt to take medication in the morning

## 2013-07-05 ENCOUNTER — Encounter: Payer: Self-pay | Admitting: Internal Medicine

## 2013-07-05 ENCOUNTER — Ambulatory Visit (INDEPENDENT_AMBULATORY_CARE_PROVIDER_SITE_OTHER): Payer: Medicare Other | Admitting: Internal Medicine

## 2013-07-05 VITALS — BP 140/78 | HR 54 | Temp 96.8°F | Resp 14 | Wt 147.4 lb

## 2013-07-05 DIAGNOSIS — G309 Alzheimer's disease, unspecified: Principal | ICD-10-CM

## 2013-07-05 DIAGNOSIS — K5909 Other constipation: Secondary | ICD-10-CM | POA: Insufficient documentation

## 2013-07-05 DIAGNOSIS — F0280 Dementia in other diseases classified elsewhere without behavioral disturbance: Secondary | ICD-10-CM

## 2013-07-05 DIAGNOSIS — J309 Allergic rhinitis, unspecified: Secondary | ICD-10-CM

## 2013-07-05 DIAGNOSIS — K5900 Constipation, unspecified: Secondary | ICD-10-CM

## 2013-07-05 DIAGNOSIS — H8110 Benign paroxysmal vertigo, unspecified ear: Secondary | ICD-10-CM

## 2013-07-05 DIAGNOSIS — M159 Polyosteoarthritis, unspecified: Secondary | ICD-10-CM | POA: Insufficient documentation

## 2013-07-05 DIAGNOSIS — E785 Hyperlipidemia, unspecified: Secondary | ICD-10-CM

## 2013-07-05 HISTORY — DX: Polyosteoarthritis, unspecified: M15.9

## 2013-07-05 HISTORY — DX: Allergic rhinitis, unspecified: J30.9

## 2013-07-05 HISTORY — DX: Benign paroxysmal vertigo, unspecified ear: H81.10

## 2013-07-05 HISTORY — DX: Dementia in other diseases classified elsewhere, unspecified severity, without behavioral disturbance, psychotic disturbance, mood disturbance, and anxiety: F02.80

## 2013-07-05 NOTE — Progress Notes (Signed)
Patient ID: Kathy Mcclain, female   DOB: Sep 09, 1914, 80 y.o.   MRN: [id number]   Location:  [facility] / [place] [facility]  Code Status: Needs to be addressed at next appt.    Allergies  Allergen Reactions  . Penicillins Hives and Itching    Chief Complaint  Patient presents with  . Medical Managment of Chronic Issues    3 month f/u with no recent labs.  Marland Kitchen other    runny nose and clearing lots of extra mucus/salivia x 2 weeks, get dizzy if she turns too fast    HPI: Patient is a 80 y.o. female seen in the office today for medical mgt of chronic diseases and acute rhinitis, increased sputum and saliva production x 2 wks, and dizziness when turning too quickly.  Does not know the reason why she can't get the flu shot, but was previously told not to...did take one two mos ago and that's when she started with a runny nose.  No pain, no headache.  Dizziness has started two weeks ago--says she is trying to get her breakfast made before her daughter in law--eats cereal or oatmeal.    Still having difficulty with constipation.  Puts 1 prune in her cheerios and 4 raisins and a tablespoon of prune juice and can have a bm.  May even have a second bm later in the day.  Has a little pain in her left hip upon trying to stand.  It catches, then is painful when she is standing.  Sometimes it will get numb down her leg.  Has been putting icy hot on there.  Relieves it a little.  Goes away after she is up a while.    Keeps busy.  Goes to the senior program in the morning doing exercises.    Says she forgets a lot.  Has not missed appts.  Forgets names.  Uses book to help her remember.  Uses book for important dates, as well.  Did forget her appt time today.  Son, Chrissie Noa, helps her get ready in the mornings and keep track of her appt.    Review of Systems:  Review of Systems  Constitutional: Negative for fever.  HENT: Negative for congestion.        Rhinitis  Eyes: Positive for  blurred vision.       Glaucoma, wears glasses  Respiratory: Negative for cough and shortness of breath.   Cardiovascular: Negative for chest pain and leg swelling.  Gastrointestinal: Positive for constipation. Negative for abdominal pain.       Stable with use of prunes and raisins  Genitourinary: Negative for dysuria.       Dark urine  Musculoskeletal: Positive for joint pain. Negative for falls.       No recent falls oob  Skin: Positive for itching. Negative for rash.       Occasional of breast  Neurological: Positive for dizziness. Negative for loss of consciousness.  Endo/Heme/Allergies: Does not bruise/bleed easily.  Psychiatric/Behavioral: Positive for memory loss. Negative for depression.    Past Medical History  Diagnosis Date  . Arthritis   . Glaucoma     Past Surgical History  Procedure Laterality Date  . Tonsilectomy/adenoidectomy with myringotomy      as child  . Amputation finger / thumb Right 1963    index  . Eye surgery Bilateral     catracts    Social History:   reports that she has never smoked. She does not  have any smokeless tobacco history on file. She reports that she does not drink alcohol or use illicit drugs.  Family History  Problem Relation Age of Onset  . Cancer Mother   . Heart disease Father   . Heart disease Brother   . Heart disease Brother   . Heart disease Brother     Medications: Patient's Medications  New Prescriptions   No medications on file  Previous Medications   ACETAMINOPHEN (TYLENOL) 500 MG TABLET    Take 500 mg by mouth once. Take one tab every day.   APRACLONIDINE (IOPIDINE) 0.5 % OPHTHALMIC SOLUTION    1 drop 2 (two) times daily.   CALCIUM GLUCONATE 500 MG TABLET    Take 1 tablet by mouth daily.   DONEPEZIL (ARICEPT) 10 MG TABLET    Take 1/2 tablet every night for 4 weeks then increase to 1 tablet nightly for memory   DORZOLAMIDE (TRUSOPT) 2 % OPHTHALMIC SOLUTION    1 drop.   GINKGO BILOBA PO    Take by mouth daily.     LATANOPROST (XALATAN) 0.005 % OPHTHALMIC SOLUTION    1 drop at bedtime.   MULTIPLE VITAMINS-MINERALS (CENTRUM SILVER PO)    Take by mouth daily.   OMEGA-3 FATTY ACIDS (FISH OIL PO)    Take by mouth.  Modified Medications   No medications on file  Discontinued Medications   NON FORMULARY    Cough Syrup: pro methazine-DM every 4-6 hours PRN for cough     Physical Exam: Filed Vitals:   07/05/13 0845  BP: 140/78  Pulse: 54  Temp: 96.8 F (36 C)  TempSrc: Oral  Resp: 14  Weight: 147 lb 6.4 oz (66.86 kg)  SpO2: 99%  Physical Exam  Constitutional: She appears well-developed and well-nourished. No distress.  HENT:  Head: Normocephalic and atraumatic.  Cardiovascular: Normal rate, regular rhythm, normal heart sounds and intact distal pulses.   Pulmonary/Chest: Effort normal and breath sounds normal. No respiratory distress.  Abdominal: Soft. Bowel sounds are normal. She exhibits no distension. There is no tenderness.  Musculoskeletal: Normal range of motion.  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  Skin: Skin is warm and dry.  Psychiatric: She has a normal mood and affect.    Labs reviewed: Basic Metabolic Panel:  Recent Labs  16/01/9604/28/14 0816  NA 146*  K 5.1  CL 108  CO2 24  GLUCOSE 134*  BUN 17  CREATININE 1.07*  CALCIUM 9.3  TSH 1.680   Liver Function Tests:  Recent Labs  09/23/12 0816  AST 17  ALT 12  ALKPHOS 55  BILITOT 0.4  PROT 6.6  CBC:  Recent Labs  09/23/12 0816  WBC 7.1  NEUTROABS 4.3  HGB 11.5  HCT 35.1  MCV 99*   Lipid Panel:  Recent Labs  09/23/12 0816  HDL 63  LDLCALC 144*  TRIG 72  CHOLHDL 3.5   Lab Results  Component Value Date   HGBA1C 6.0 08/10/2012   Assessment/Plan 1. Alzheimer's disease - seems to be moderate at this point--her  son helps her get ready each morning, but she is able to prepare her own cereal, keeps track of names and dates with a book -did not tolerate aricept due to bad dreams -seems to do quite well without meds and with assistance of her son (and daughter in  [place]), also goes to senior center - Ambulatory referral to Home Health  2. Chronic constipation -prune juice, prunes and raisins are working well with bid bms  3. Generalized osteoarthritis of multiple sites -seems to involve neck, hands, back primarily -cont use of tylenol and icy hot with benefit - Ambulatory referral to Home Health  4. Other and unspecified hyperlipidemia -we decided not to treat with a statin as risks of myopathy outweigh benefits at her age  108. Benign paroxysmal positional vertigo -seems to be etiology of early morning "dizziness" when she turns quickly when preparing her breakfast -will benefit from Epley maneuver to help diminish symptoms -also could help with use of rollator for balance - Ambulatory referral to Home Health  6. Allergic rhinitis -suspected etiology of runny nose and increase sputum production -discussed with her son that medications for this have bad side effects in her age group--dry mouth, confusion, falls, urinary retention and worsened constipation  Labs/tests ordered:  None needed, would check at next visit  Next appt:  6 mos when back from [place]

## 2014-02-11 ENCOUNTER — Ambulatory Visit: Payer: Medicare Other | Admitting: Internal Medicine

## 2014-02-28 DIAGNOSIS — H401190 Primary open-angle glaucoma, unspecified eye, stage unspecified: Secondary | ICD-10-CM | POA: Insufficient documentation

## 2014-03-07 ENCOUNTER — Ambulatory Visit: Payer: Medicare Other | Admitting: Internal Medicine

## 2014-04-14 ENCOUNTER — Ambulatory Visit (INDEPENDENT_AMBULATORY_CARE_PROVIDER_SITE_OTHER): Payer: Medicare Other | Admitting: Internal Medicine

## 2014-04-14 ENCOUNTER — Encounter: Payer: Self-pay | Admitting: Internal Medicine

## 2014-04-14 ENCOUNTER — Ambulatory Visit (INDEPENDENT_AMBULATORY_CARE_PROVIDER_SITE_OTHER): Payer: Medicare Other | Admitting: *Deleted

## 2014-04-14 VITALS — BP 132/70 | HR 64 | Temp 97.6°F | Resp 18 | Ht 60.7 in | Wt 145.6 lb

## 2014-04-14 DIAGNOSIS — R739 Hyperglycemia, unspecified: Secondary | ICD-10-CM

## 2014-04-14 DIAGNOSIS — K5900 Constipation, unspecified: Secondary | ICD-10-CM

## 2014-04-14 DIAGNOSIS — F0280 Dementia in other diseases classified elsewhere without behavioral disturbance: Secondary | ICD-10-CM

## 2014-04-14 DIAGNOSIS — H9193 Unspecified hearing loss, bilateral: Secondary | ICD-10-CM

## 2014-04-14 DIAGNOSIS — K5909 Other constipation: Secondary | ICD-10-CM

## 2014-04-14 DIAGNOSIS — G309 Alzheimer's disease, unspecified: Secondary | ICD-10-CM

## 2014-04-14 DIAGNOSIS — Z23 Encounter for immunization: Secondary | ICD-10-CM

## 2014-04-14 DIAGNOSIS — H409 Unspecified glaucoma: Secondary | ICD-10-CM

## 2014-04-14 DIAGNOSIS — H8113 Benign paroxysmal vertigo, bilateral: Secondary | ICD-10-CM

## 2014-04-14 DIAGNOSIS — H6121 Impacted cerumen, right ear: Secondary | ICD-10-CM

## 2014-04-14 MED ORDER — PNEUMOCOCCAL 13-VAL CONJ VACC IM SUSP
0.5000 mL | Freq: Once | INTRAMUSCULAR | Status: AC
  Administered 2014-04-14: 0.5 mL via INTRAMUSCULAR

## 2014-04-14 NOTE — Progress Notes (Signed)
Patient ID: Kathy Mcclain, female   DOB: 07/10/1914, 80 y.o.   MRN: [id number]   Location:  [facility]  Code Status: her son and daughter are HCPOAs--her son will bring a copy for us  Allergies  Allergen Reactions  . Penicillins Hives and Itching    Chief Complaint  Patient presents with  . Medical Management of Chronic Issues    dizzy spells, constipation    HPI: Patient is a 80 y.o. female seen in the office today for med mgt chronic diseases.  She is here with her son.  She has a h/o arthritis, hyperglycemia, AD, hyperlipidemia, constipation, BPPV, and allergies.  C/o dizzy spells and constipation.  Says she will take the aricept to help her memory.    She is fixated on her bowels.  Strains a lot to go to the bathroom.  If takes milk of mag.  Also uses glycerin suppository and preparation H.  Does not have abdominal pain or bloating. Her son notes that she has one large loose stool daily first thing in am.  [facility] and can't take the one exercise, and it makes her too dizzy.    Has been with her son for about 3 mos.  Had not c/o the dizziness recently since she's been with him.  Can happen when walking or when gets up in the morning.   Does get tinnitus, also at times.  HOH.  Does have hearing aides.  They got misplaced moving from [place] back here (back and forth every 6 mos).   Review of Systems:  Review of Systems  Constitutional: Negative for fever.  HENT: Positive for hearing loss and tinnitus. Negative for congestion.   Eyes: Negative for blurred vision.       Wears glasses  Respiratory: Negative for shortness of breath.   Cardiovascular: Negative for chest pain.  Gastrointestinal: Positive for constipation.       Does not truly have constipation, but has difficulty evacuating stools she says--son disagrees  Genitourinary: Negative for dysuria.  Musculoskeletal: Negative for myalgias, back pain, joint pain, falls and neck  pain.  Skin: Negative for rash.  Neurological: Positive for dizziness.  Psychiatric/Behavioral: Positive for memory loss.    Past Medical History  Diagnosis Date  . Arthritis   . Glaucoma     Past Surgical History  Procedure Laterality Date  . Tonsilectomy/adenoidectomy with myringotomy      as child  . Amputation finger / thumb Right 1963    index  . Eye surgery Bilateral     catracts    Social History:   reports that she has never smoked. She does not have any smokeless tobacco history on file. She reports that she does not drink alcohol or use illicit drugs.  Family History  Problem Relation Age of Onset  . Cancer Mother   . Heart disease Father   . Heart disease Brother   . Heart disease Brother   . Heart disease Brother     Medications: Patient's Medications  New Prescriptions   No medications on file  Previous Medications   ACETAMINOPHEN (TYLENOL) 500 MG TABLET    Take 500 mg by mouth once. Take one tab every day.   APRACLONIDINE (IOPIDINE) 0.5 % OPHTHALMIC SOLUTION    1 drop 2 (two) times daily.   CALCIUM GLUCONATE 500 MG TABLET    Take 1 tablet by mouth daily.   DONEPEZIL (ARICEPT) 10 MG TABLET  Take 1/2 tablet every night for 4 weeks then increase to 1 tablet nightly for memory   DORZOLAMIDE (TRUSOPT) 2 % OPHTHALMIC SOLUTION    1 drop.   GINKGO BILOBA PO    Take by mouth daily.   LATANOPROST (XALATAN) 0.005 % OPHTHALMIC SOLUTION    1 drop at bedtime.   MULTIPLE VITAMINS-MINERALS (CENTRUM SILVER PO)    Take by mouth daily.   OMEGA-3 FATTY ACIDS (FISH OIL PO)    Take by mouth.  Modified Medications   No medications on file  Discontinued Medications   No medications on file     Physical Exam: Filed Vitals:   04/14/14 0917  BP: 132/70  Pulse: 64  Temp: 97.6 F (36.4 C)  TempSrc: Oral  Resp: 18  Height: 5' 0.7" (1.542 m)  Weight: 145 lb 9.6 oz (66.044 kg)  SpO2: 98%   Physical Exam  Constitutional: She appears well-developed and  well-nourished. No distress.  HENT:  Right cerumen impaction--dry cerumen  Eyes:  glasses  Cardiovascular: Normal rate, regular rhythm, normal heart sounds and intact distal pulses.   Pulmonary/Chest: Effort normal and breath sounds normal. No respiratory distress.  Abdominal: Soft. Bowel sounds are normal. She exhibits no distension and no mass. There is no tenderness.  Skin: Skin is warm and dry.  Has vitiligo on head with alopecia    Labs reviewed: Lab Results  Component Value Date   HGBA1C 6.0 08/10/2012   Assessment/Plan 1. Alzheimer's disease - restart taking the aricept daily--emphasized that it may help her bowels move also - CBC With differential/Platelet - Comprehensive metabolic panel - Lipid panel  2. Benign paroxysmal positional vertigo, bilateral - seems to be etiology of her dizziness--[facility]--unsure what [facility]--her son is able to take her to outpatient PT for Epley maneuver - Ambulatory referral to Physical Therapy  3. Chronic constipation -does not seem like she is truly constipated--advised to only use MOM after 3 days of no bm unless she develops pain -cont prunes, high fiber diet - Comprehensive metabolic panel  4. Hyperglycemia - will f/u labs - Comprehensive metabolic panel - Hemoglobin A1c - Lipid panel  5. Glaucoma -cont 3 drops per ophtho  6. Hearing loss, bilateral -will need hearing aide reassessment as these got lost, will first try to get right ear cleaned out  7. Cerumen impaction, right -debrox instruction provided  8. Need for vaccination with 13-polyvalent pneumococcal conjugate vaccine -prevnar given  9. Need for prophylactic vaccination and inoculation against influenza -flu shot given   Labs/tests ordered:   Orders Placed This Encounter  Procedures  . CBC With differential/Platelet  . Comprehensive metabolic panel    Order Specific Question:  Has the patient fasted?    Answer:  Yes  . Hemoglobin A1c    . Lipid panel    Order Specific Question:  Has the patient fasted?    Answer:  Yes  . Ambulatory referral to Physical Therapy    Referral Priority:  Routine    Referral Type:  Physical Medicine    Referral Reason:  Specialty Services Required    Requested Specialty:  Physical Therapy    Number of Visits Requested:  1    Next appt:  6 mos for annual exam with mmse with Jessica  Jullianna Gabor L. Kiri Hinderliter, D.O. Geriatrics [facility] [address] [place], [place] [address] Cell Phone (Mon-Fri 8am-5pm):  [phone] On Call:  [phone] & follow prompts after 5pm & weekends  Office Phone:  [phone] Office Fax:  [phone]

## 2014-04-14 NOTE — Patient Instructions (Addendum)
Do not use any bowel medications over the counter (milk of magnesia) unless you have not had a bm in 3 days OR you have abdominal pain from not having a BM.    Take your aricept every day for your memory and it may help your bowels move regularly.  You got your flu and pneumonia vaccines today.  I have ordered therapy for you to help with your vertigo.  Use debrox drops (over the counter) in your right ear (5 drops in right ear at night and cotton ball placement overnight for 7 days).

## 2014-04-18 ENCOUNTER — Ambulatory Visit: Payer: Medicare Other | Attending: Internal Medicine | Admitting: Physical Therapy

## 2014-04-18 ENCOUNTER — Encounter: Payer: Self-pay | Admitting: Physical Therapy

## 2014-04-18 DIAGNOSIS — R42 Dizziness and giddiness: Secondary | ICD-10-CM | POA: Diagnosis not present

## 2014-04-18 DIAGNOSIS — H8113 Benign paroxysmal vertigo, bilateral: Secondary | ICD-10-CM | POA: Diagnosis present

## 2014-04-18 NOTE — Therapy (Signed)
[facility] [address] [place], [place], [address] Phone: [phone]   Fax:  [phone]  Physical Therapy Evaluation  Patient Details  Name: Kathy Mcclain MRN: [id number] Date of Birth: 03/22/1915  Encounter Date: 04/18/2014      PT End of Session - 04/18/14 1045    Visit Number 1  G1   Number of Visits 1   Date for PT Re-Evaluation 05/19/14   Authorization Type [employer]   PT Start Time 0800   PT Stop Time 0848   PT Time Calculation (min) 48 min   Activity Tolerance Patient tolerated treatment well      Past Medical History  Diagnosis Date  . Arthritis   . Glaucoma     Past Surgical History  Procedure Laterality Date  . Tonsilectomy/adenoidectomy with myringotomy      as child  . Amputation finger / thumb Right 1963    index  . Eye surgery Bilateral     catracts    There were no vitals taken for this visit.  Visit Diagnosis:  BPPV (benign paroxysmal positional vertigo), bilateral - Plan: PT plan of care cert/re-cert  Dizziness and giddiness - Plan: PT plan of care cert/re-cert      Subjective Assessment - 04/18/14 1026    Symptoms Pt.is a 80 year old lady who reports she feels that she is doing fine but does c/o intermittent vertigo with getting up out of car and with sit to stand and with supine to sit; pt.'s son, accompanying pt reports pt. has c/o vertigo past 5 years intermittenlty   Pertinent History cataract sx on R eye and surgery on left eye - exact procedure unknown on left eye   Currently in Pain? No/denies          [facility] PT Assessment - 04/18/14 0001    Assessment   Medical Diagnosis BPPV - bilateral   Onset Date --  past 5 years intermittently per son's report   Prior Therapy none   Balance Screen   Has the patient fallen in the past 6 months Yes   How many times? 1   Has the patient had a decrease in activity level because of a fear of falling?  No   Is the patient  reluctant to leave their home because of a fear of falling?  No   Home Environment   Living Enviornment Private residence   Living Arrangements --  pt. lives with her son   Additional Comments [facility]   Prior Function   Level of Independence Independent with homemaking with ambulation            Vestibular Assessment - 04/18/14 1036    General Observation pt. is ambulating modified independently with use of cane   Type of Dizziness Lightheadedness   Frequency of Dizziness intermittent   Aggravating Factors Sit to stand;Comment  supine to sit   Sit to Supine No dizziness   Supine to Sitting Moderate dizziness   Right Hallpike No dizziness   Up from Right Hallpike Mild dizziness   Up from Left Hallpike Mild dizziness   Positional Sensitivities Comments no nystagmus observed with right nor left sidelying test and no vertigo reported in test position   BP supine (x 5 minutes) 71/50 mmHg   HR supine (x 5 minutes) 55   BP sitting 105/55 mmHg   HR sitting 60   BP standing (after 1 minute) 92/58  mmHg   HR standing (after 1 minute) 64   BP standing (after 3 minutes) 92/45 mmHg   HR standing (after 3 minutes) 63   Orthostatics Comment pt. reports dizziness with supine to sit and with sit to stand; pt. had some mild LOB with return to upright from bil., Dix-Hallpike test positions                      PT Education - 04/18/14 1044    Education provided Yes   Education Details marching and ankle pumps with transfers; to  wait a few minutes upon sitting and upon standing to allow light-headedness to resolve   Person(s) Educated Child(ren);Patient   Methods Explanation;Demonstration   Comprehension Verbalized understanding             PT Long Term Goals - 04/18/14 1053    PT LONG TERM GOAL #1   Title N/A - initial eval only as pt. cannot benefit from PT at this time               Plan - 04/18/14 1046    Clinical  Impression Statement Pt.'s symptoms are not consistent with bil. BPPV as no c/o vertigo in Dix-Hallpike test position and no nystagmus observed at any time during initial evaluation; pt. appears to have orthostatic hypotension as BP drops in dfifferent positions; pt.'s son was instructed  in low BP recordings   PT Frequency 1x / week   PT Duration --  1 week - eval only   PT Next Visit Plan Eval only as pt. symptoms are not  consistent with vestibular dysfunction but rather orthostatic hypotension - pt. was instructed to follow-up with MD for any further interventions needed   PT Home Exercise Plan try marching and ankle pumps prior to transfers to assist with attempting to incr. circulation   Consulted and Agree with Plan of Care Patient;Family member/caregiver   Family Member Consulted pt.'s son          G-Codes - 04/18/14 1054    Functional Assessment Tool Used Bil. Dix-Hallpike tests negative with no nystagmus and no c/o vertigo in test positions;   Functional Limitation Other PT primary   Other PT Primary Current Status ([id number]) At least 20 percent but less than 40 percent impaired, limited or restricted   Other PT Primary Goal Status ([id number]([id number]) At least 20 percent but less than 40 percent impaired, limited or restricted   Other PT Primary Discharge Status [phone]([id number]) At least 20 percent but less than 40 percent impaired, limited or restricted       Problem List Patient Active Problem List   Diagnosis Date Noted  . Alzheimer's disease 07/05/2013  . Chronic constipation 07/05/2013  . Generalized osteoarthritis of multiple sites 07/05/2013  . Benign paroxysmal positional vertigo 07/05/2013  . Allergic rhinitis 07/05/2013  . Memory loss 03/31/2013  . Other and unspecified hyperlipidemia 03/31/2013  . Unspecified constipation 03/31/2013  . Dry skin 03/31/2013  . Hyperglycemia 09/22/2012  . Arthritis 08/10/2012    Kary KosDilday, Nusaiba Guallpa Suzanne 04/18/2014, 11:01 AM  [facility] [address] [place], [place], [address] Phone: [phone]   Fax:  [phone]

## 2014-04-18 NOTE — Patient Instructions (Signed)
Instructed pt.'s and pt. In waiting a few minutes before standing with supine to sit transfer and with sit to stand transfer to let light-headedness resolve to minimize fall risk.  PT. Was also instructed in marching upon standing from seated position and to perform ankle pumps in seated prior to standing to assist in increasing circulation and to try to hlep minimize feeling of light-headedness.

## 2014-04-19 ENCOUNTER — Other Ambulatory Visit: Payer: Self-pay

## 2014-04-19 DIAGNOSIS — R413 Other amnesia: Secondary | ICD-10-CM

## 2014-04-19 MED ORDER — DONEPEZIL HCL 10 MG PO TABS: ORAL_TABLET | ORAL | Status: DC

## 2014-08-08 ENCOUNTER — Ambulatory Visit (INDEPENDENT_AMBULATORY_CARE_PROVIDER_SITE_OTHER): Payer: Medicare Other | Admitting: Internal Medicine

## 2014-08-08 ENCOUNTER — Encounter: Payer: Self-pay | Admitting: Internal Medicine

## 2014-08-08 VITALS — BP 150/70 | HR 69 | Temp 98.1°F | Resp 20 | Ht 60.0 in | Wt 150.0 lb

## 2014-08-08 DIAGNOSIS — R413 Other amnesia: Secondary | ICD-10-CM

## 2014-08-08 DIAGNOSIS — J069 Acute upper respiratory infection, unspecified: Secondary | ICD-10-CM | POA: Diagnosis not present

## 2014-08-08 DIAGNOSIS — B9789 Other viral agents as the cause of diseases classified elsewhere: Principal | ICD-10-CM

## 2014-08-08 MED ORDER — DONEPEZIL HCL 10 MG PO TABS: ORAL_TABLET | ORAL | Status: DC

## 2014-08-08 NOTE — Patient Instructions (Addendum)
Drink plenty of water and get plenty of sleep.  Robitussin expectorant cough syrup for daytime to help get mucus out of chest Also warm humidity may help.  At night, take a cough suppressant cough syrup to stop the coughing.    Come back if you get a fever, are not getting better after a week, or you lose your appetite.

## 2014-08-08 NOTE — Progress Notes (Signed)
Patient ID: Kathy Mcclain, female   DOB: 10/29/1914, 81 y.o.   MRN: [id number]   Location:  [facility]   Allergies  Allergen Reactions  . Penicillins Hives and Itching    Chief Complaint  Patient presents with  . Acute Visit    deep cough x 2 days    HPI: Patient is a 81 y.o. white female seen in the office today for an acute visit due to deep cough for 2 days.  She is accompanied by her son.  Last night, kept her son running to check on her.  Almost gagging when coughing.  No fever.  Honey, vinegar home remedy for mucus in throat and drinking hot lemon juice.  Her son has been hoarse and coughing, but he did not have it before. [facility].   Review of Systems:  Review of Systems  Constitutional: Negative for fever, chills and malaise/fatigue.  HENT: Positive for congestion and hearing loss. Negative for sore throat.   Eyes:       Left eye red vs. Right (chronic with glaucoma)  Respiratory: Positive for cough, sputum production and wheezing. Negative for shortness of breath.   Cardiovascular: Negative for chest pain and leg swelling.  Gastrointestinal: Negative for nausea, vomiting, abdominal pain and diarrhea.       Appetite is good  Genitourinary: Negative for dysuria, urgency and frequency.  Musculoskeletal: Negative for myalgias and falls.  Neurological: Negative for dizziness, weakness and headaches.  Psychiatric/Behavioral: Positive for memory loss.     Past Medical History  Diagnosis Date  . Arthritis   . Glaucoma     Past Surgical History  Procedure Laterality Date  . Tonsilectomy/adenoidectomy with myringotomy      as child  . Amputation finger / thumb Right 1963    index  . Eye surgery Bilateral     catracts    Social History:   reports that she has never smoked. She does not have any smokeless tobacco history on file. She reports that she does not drink alcohol or use illicit  drugs.  Family History  Problem Relation Age of Onset  . Cancer Mother   . Heart disease Father   . Heart disease Brother   . Heart disease Brother   . Heart disease Brother     Medications: Patient's Medications  New Prescriptions   No medications on file  Previous Medications   ACETAMINOPHEN (TYLENOL) 500 MG TABLET    Take 500 mg by mouth once. Take one tab every day.   APRACLONIDINE (IOPIDINE) 0.5 % OPHTHALMIC SOLUTION    1 drop 2 (two) times daily.   CALCIUM GLUCONATE 500 MG TABLET    Take 1 tablet by mouth daily.   DORZOLAMIDE (TRUSOPT) 2 % OPHTHALMIC SOLUTION    1 drop.   GINKGO BILOBA PO    Take by mouth daily.   LATANOPROST (XALATAN) 0.005 % OPHTHALMIC SOLUTION    1 drop at bedtime.   MULTIPLE VITAMINS-MINERALS (CENTRUM SILVER PO)    Take by mouth daily.   OMEGA-3 FATTY ACIDS (FISH OIL PO)    Take by mouth.   TIMOLOL (TIMOPTIC) 0.5 % OPHTHALMIC SOLUTION    Apply to eye.  Modified Medications   Modified Medication Previous Medication   DONEPEZIL (ARICEPT) 10 MG TABLET donepezil (ARICEPT) 10 MG tablet      1/2 tablet every other day    1 tablet nightly for memory  Discontinued Medications  No medications on file     Physical Exam: Filed Vitals:   08/08/14 1141  BP: 150/70  Pulse: 69  Temp: 98.1 F (36.7 C)  TempSrc: Oral  Resp: 20  Height: 5' (1.524 m)  Weight: 150 lb (68.04 kg)  SpO2: 94%  Physical Exam  Constitutional: She appears well-developed and well-nourished. No distress.  HENT:  Head: Normocephalic and atraumatic.  Right Ear: External ear normal.  Left Ear: External ear normal.  Nose: Nose normal.  Mouth/Throat: Oropharynx is clear and moist.  Left ear with significant cerumen in canal (family requests flush next time)  Eyes: Conjunctivae and EOM are normal. Pupils are equal, round, and reactive to light.  Redness of left eye vs. Right; no significant drainage  Neck: Neck supple.  Cardiovascular: Normal rate, regular rhythm, normal heart  sounds and intact distal pulses.   Pulmonary/Chest: Effort normal and breath sounds normal. No respiratory distress. She has no wheezes. She has no rales.  Abdominal: Soft.  Musculoskeletal: Normal range of motion.  Lymphadenopathy:    She has no cervical adenopathy.  Neurological: She is alert.  Skin: Skin is warm and dry. There is pallor.  Psychiatric: She has a normal mood and affect.    Labs reviewed:  Lab Results  Component Value Date   HGBA1C 6.0 08/10/2012     Assessment/Plan 1. Viral URI with cough - encouraged conservative treatment--may cont home remedies for some symptom relief, plenty of water and rest, warm humidity -also recommended two otc cough syrups:  Expectorant for daytime and suppressant for overnight for symptom relief -if not getting better, gets fever, poor intake, return to see us  2. Memory loss -medication renewed - donepezil (ARICEPT) 10 MG tablet; 1/2 tablet every other day  Dispense: 90 tablet; Refill: 1 -not any different than her norm with #1  Labs/tests ordered:  No new Next appt:  Prn, keep regular appt with Kathy Mcclain  Kathy Mcclain, D.O. Geriatrics [facility] [address] [place], [place] [address] Cell Phone (Mon-Fri 8am-5pm):  [phone] On Call:  [phone] & follow prompts after 5pm & weekends Office Phone:  [phone] Office Fax:  [phone]

## 2014-09-07 DIAGNOSIS — Z961 Presence of intraocular lens: Secondary | ICD-10-CM | POA: Insufficient documentation

## 2014-12-13 ENCOUNTER — Encounter: Payer: Self-pay | Admitting: Nurse Practitioner

## 2014-12-13 ENCOUNTER — Ambulatory Visit (INDEPENDENT_AMBULATORY_CARE_PROVIDER_SITE_OTHER): Payer: Medicare Other | Admitting: Nurse Practitioner

## 2014-12-13 VITALS — BP 128/72 | HR 61 | Temp 98.0°F | Ht 60.0 in | Wt 155.0 lb

## 2014-12-13 DIAGNOSIS — L97511 Non-pressure chronic ulcer of other part of right foot limited to breakdown of skin: Secondary | ICD-10-CM

## 2014-12-13 DIAGNOSIS — K5909 Other constipation: Secondary | ICD-10-CM

## 2014-12-13 DIAGNOSIS — F0280 Dementia in other diseases classified elsewhere without behavioral disturbance: Secondary | ICD-10-CM

## 2014-12-13 DIAGNOSIS — M1990 Unspecified osteoarthritis, unspecified site: Secondary | ICD-10-CM

## 2014-12-13 DIAGNOSIS — K5900 Constipation, unspecified: Secondary | ICD-10-CM | POA: Diagnosis not present

## 2014-12-13 DIAGNOSIS — G309 Alzheimer's disease, unspecified: Secondary | ICD-10-CM

## 2014-12-13 MED ORDER — MEMANTINE HCL ER 7 & 14 & 21 &28 MG PO CP24: ORAL_CAPSULE | ORAL | Status: DC

## 2014-12-13 NOTE — Progress Notes (Signed)
Patient ID: Kathy Mcclain, female   DOB: June 05, 1914, 81 y.o.   MRN: [id number]    PCP: Sharon Seller, NP  Allergies  Allergen Reactions  . Penicillins Hives and Itching    Chief Complaint  Patient presents with  . Acute Visit    Examine right foot (big toe). Patient seen foot doctor in [place].      HPI: Patient is a 81 y.o. female seen in the office today for follow up on right foot.She has a h/o arthritis, hyperglycemia, AD, hyperlipidemia, constipation, BPPV, and allergies. Pt was seen by foot doctor in [place] (she lives there half the year). Son reports MRI done, no infection.  Rx given for hydrogel and they have been using for twice daily with good results. Would like this rechecked today  Pt did not tolerate Aricept every other day due to hallucinations. Son giving it every 3 days   Has been in [place] since may, [place], will be there until November   No further dizziness  Arthritis pain controlled on tylenol. No worsening pain.   Review of Systems:  Review of Systems  Constitutional: Negative for fever, chills, activity change and appetite change.  Respiratory: Negative for shortness of breath.   Cardiovascular: Negative for chest pain and leg swelling.  Gastrointestinal: Positive for constipation (controlled on medication). Negative for abdominal pain and diarrhea.  Genitourinary: Negative for dysuria.  Musculoskeletal: Positive for arthralgias (controlled on tylenol).  Skin: Negative for wound.  Neurological: Negative for dizziness and weakness.  Psychiatric/Behavioral: Positive for confusion (memory loss).    Past Medical History  Diagnosis Date  . Arthritis   . Glaucoma    Past Surgical History  Procedure Laterality Date  . Tonsilectomy/adenoidectomy with myringotomy      as child  . Amputation finger / thumb Right 1963    index  . Eye surgery Bilateral     catracts   Social History:   reports that she has never smoked.  She does not have any smokeless tobacco history on file. She reports that she does not drink alcohol or use illicit drugs.  Family History  Problem Relation Age of Onset  . Cancer Mother   . Heart disease Father   . Heart disease Brother   . Heart disease Brother   . Heart disease Brother     Medications: Patient's Medications  New Prescriptions   No medications on file  Previous Medications   ACETAMINOPHEN (TYLENOL) 500 MG TABLET    Take 500 mg by mouth once. Take one tab every day.   APRACLONIDINE (IOPIDINE) 0.5 % OPHTHALMIC SOLUTION    1 drop 2 (two) times daily.   CALCIUM GLUCONATE 500 MG TABLET    Take 1 tablet by mouth daily.   DORZOLAMIDE (TRUSOPT) 2 % OPHTHALMIC SOLUTION    1 drop.   GINKGO BILOBA PO    Take by mouth daily.   LATANOPROST (XALATAN) 0.005 % OPHTHALMIC SOLUTION    1 drop at bedtime.   MULTIPLE VITAMINS-MINERALS (CENTRUM SILVER PO)    Take by mouth daily.   OMEGA-3 FATTY ACIDS (FISH OIL PO)    Take by mouth.   TIMOLOL (TIMOPTIC) 0.5 % OPHTHALMIC SOLUTION    Apply to eye.   WOUND DRESSINGS (HYDROGEL) GEL    Apply topically. Apply to right foot, big toe twice daily  Modified Medications   Modified Medication Previous Medication   DONEPEZIL (ARICEPT) 10 MG TABLET donepezil (ARICEPT) 10 MG  tablet      1/2 tablet every 2-3 days    1/2 tablet every other day  Discontinued Medications   No medications on file     Physical Exam:  Filed Vitals:   12/13/14 0929  BP: 128/72  Pulse: 61  Temp: 98 F (36.7 C)  TempSrc: Oral  Height: 5' (1.524 m)  Weight: 155 lb (70.308 kg)  SpO2: 94%    Physical Exam  Constitutional: She appears well-developed and well-nourished. No distress.  HENT:  Head: Normocephalic and atraumatic.  Right Ear: External ear normal.  Left Ear: External ear normal.  Nose: Nose normal.  Mouth/Throat: Oropharynx is clear and moist.  Eyes: Conjunctivae and EOM are normal. Pupils are equal, round, and reactive to light.  Neck: Normal  range of motion. Neck supple.  Cardiovascular: Normal rate, regular rhythm and normal heart sounds.   Pulmonary/Chest: Effort normal and breath sounds normal.  Abdominal: Soft. Bowel sounds are normal.  Musculoskeletal: Normal range of motion.  Neurological: She is alert.  Skin: Skin is warm and dry.  Psychiatric: She has a normal mood and affect.    Labs reviewed: Basic Metabolic Panel: No results for input(s): NA, K, CL, CO2, GLUCOSE, BUN, CREATININE, CALCIUM, MG, PHOS, TSH in the last 8760 hours. Liver Function Tests: No results for input(s): AST, ALT, ALKPHOS, BILITOT, PROT, ALBUMIN in the last 8760 hours. No results for input(s): LIPASE, AMYLASE in the last 8760 hours. No results for input(s): AMMONIA in the last 8760 hours. CBC: No results for input(s): WBC, NEUTROABS, HGB, HCT, MCV, PLT in the last 8760 hours. Lipid Panel: No results for input(s): CHOL, HDL, LDLCALC, TRIG, CHOLHDL, LDLDIRECT in the last 8760 hours. TSH: No results for input(s): TSH in the last 8760 hours. A1C: Lab Results  Component Value Date   HGBA1C 6.0 08/10/2012     Assessment/Plan  1. Ulcer of great toe, right, limited to breakdown of skin -has resolved, cont recommendations per podiatrist.   2. Alzheimer's disease -stable, no acute changes in cognitive or functional status -will start namenda titration pack -to cont namenda 26 mg XR daily, will need refill once complete  3. Chronic constipation -well controlled on current regimen  4. Arthritis  -without worsening pain, cont on tylenol PRN  Will follow up CBC and BMP today   Jairus Tonne K. Biagio Borg  [facility] [phone] 8 am - 5 pm) [phone] (after hours)

## 2014-12-13 NOTE — Patient Instructions (Signed)
Stop aricept  Start namenda XR titration pack, we will need send refill to pharmacy once titration pack complete.   Follow up in 4 months, sooner if needed Call if you have questions.

## 2014-12-14 LAB — CBC WITH DIFFERENTIAL/PLATELET
BASOS ABS: 0 10*3/uL (ref 0.0–0.2)
Basos: 0 %
EOS (ABSOLUTE): 0.2 10*3/uL (ref 0.0–0.4)
Eos: 3 %
Hematocrit: 33.7 % — ABNORMAL LOW (ref 34.0–46.6)
Hemoglobin: 11.1 g/dL (ref 11.1–15.9)
Immature Grans (Abs): 0 10*3/uL (ref 0.0–0.1)
Immature Granulocytes: 0 %
LYMPHS ABS: 2 10*3/uL (ref 0.7–3.1)
LYMPHS: 26 %
MCH: 32.4 pg (ref 26.6–33.0)
MCHC: 32.9 g/dL (ref 31.5–35.7)
MCV: 98 fL — ABNORMAL HIGH (ref 79–97)
Monocytes Absolute: 0.9 10*3/uL (ref 0.1–0.9)
Monocytes: 12 %
NEUTROS ABS: 4.5 10*3/uL (ref 1.4–7.0)
Neutrophils: 59 %
PLATELETS: 176 10*3/uL (ref 150–379)
RBC: 3.43 x10E6/uL — ABNORMAL LOW (ref 3.77–5.28)
RDW: 14.9 % (ref 12.3–15.4)
WBC: 7.6 10*3/uL (ref 3.4–10.8)

## 2014-12-14 LAB — BASIC METABOLIC PANEL
BUN / CREAT RATIO: 17 (ref 11–26)
BUN: 20 mg/dL (ref 10–36)
CHLORIDE: 102 mmol/L (ref 97–108)
CO2: 26 mmol/L (ref 18–29)
Calcium: 9.2 mg/dL (ref 8.7–10.3)
Creatinine, Ser: 1.2 mg/dL — ABNORMAL HIGH (ref 0.57–1.00)
GFR calc Af Amer: 43 mL/min/{1.73_m2} — ABNORMAL LOW (ref >59)
GFR calc non Af Amer: 38 mL/min/{1.73_m2} — ABNORMAL LOW (ref >59)
GLUCOSE: 130 mg/dL — AB (ref 65–99)
Potassium: 5.2 mmol/L (ref 3.5–5.2)
SODIUM: 142 mmol/L (ref 134–144)

## 2015-08-18 ENCOUNTER — Encounter: Payer: Self-pay | Admitting: Internal Medicine

## 2015-08-18 ENCOUNTER — Ambulatory Visit (INDEPENDENT_AMBULATORY_CARE_PROVIDER_SITE_OTHER): Payer: Medicare Other | Admitting: Internal Medicine

## 2015-08-18 VITALS — BP 120/62 | HR 53 | Temp 98.1°F | Ht 60.0 in | Wt 156.0 lb

## 2015-08-18 DIAGNOSIS — H409 Unspecified glaucoma: Secondary | ICD-10-CM

## 2015-08-18 DIAGNOSIS — F0280 Dementia in other diseases classified elsewhere without behavioral disturbance: Secondary | ICD-10-CM | POA: Diagnosis not present

## 2015-08-18 DIAGNOSIS — K5900 Constipation, unspecified: Secondary | ICD-10-CM | POA: Diagnosis not present

## 2015-08-18 DIAGNOSIS — G309 Alzheimer's disease, unspecified: Secondary | ICD-10-CM | POA: Diagnosis not present

## 2015-08-18 DIAGNOSIS — Z23 Encounter for immunization: Secondary | ICD-10-CM | POA: Diagnosis not present

## 2015-08-18 DIAGNOSIS — R42 Dizziness and giddiness: Secondary | ICD-10-CM

## 2015-08-18 DIAGNOSIS — K5909 Other constipation: Secondary | ICD-10-CM

## 2015-08-18 NOTE — Patient Instructions (Signed)
Drink 6 8oz glasses of water per day. This will help your bowel movements.  Get up slowly to prevent falls.

## 2015-08-18 NOTE — Progress Notes (Signed)
Patient ID: Kathy Mcclain, female   DOB: 05/13/1914, 82 y.o.   MRN: [id number]   [facility] Provider:  Uyen Eichholz L. Renato Gailseed, D.O., C.M.D.  Code Status: this and advance directives need to be addressed   Chief Complaint  Patient presents with  . Medical Management of Chronic Issues    medication management. Son Chrissie Noa-William requested appt before she goes to [place] 6 months.   . Dizziness    off and on for 5 years, not any worse    HPI: Patient is a 82 y.o. female seen today for medical management of chronic diseases.  Has been dizzy for 5 years per her son.  Happens after she's been up a couple of hours, it lasts about an hour.  Happens when she goes to stand up. Does use glaucoma drops--has three different drops--3 different ones at night and 2 in the morning.      She did not have vertigo it turned out.  Seems to be more situational.  Chronic constipation:  Sits for an hour and may still not go.  Eats prunes daily.  Discussed hydration.  Her son is trying to give her something only if she has not had   Memory is worsening.  She did not tolerate aricept.  Had nightmares at first.  Now is very gradually increasing the namenda XR.  Was looking at the wrong end of the house for the bathroom.    Past Medical History  Diagnosis Date  . Arthritis   . Glaucoma     Past Surgical History  Procedure Laterality Date  . Tonsilectomy/adenoidectomy with myringotomy      as child  . Amputation finger / thumb Right 1963    index  . Eye surgery Bilateral     catracts    Allergies  Allergen Reactions  . Penicillins Hives and Itching      Medication List       This list is accurate as of: 08/18/15  8:36 AM.  Always use your most recent med list.               acetaminophen 500 MG tablet  Commonly known as:  TYLENOL  Take 500 mg by mouth once. Take one tab every day.     apraclonidine 0.5 % ophthalmic solution  Commonly known as:  IOPIDINE  1 drop 2 (two) times daily.     calcium gluconate 500 MG tablet  Take 1 tablet by mouth daily.     CENTRUM SILVER PO  Take by mouth daily.     dorzolamide 2 % ophthalmic solution  Commonly known as:  TRUSOPT  1 drop.     FISH OIL PO  Take by mouth.     GINKGO BILOBA PO  Take by mouth daily.     HYDROGEL Gel  Apply topically. Apply to right foot, big toe twice daily     latanoprost 0.005 % ophthalmic solution  Commonly known as:  XALATAN  1 drop at bedtime.     Memantine HCl ER 7 & 14 & 21 &28 MG Cp24  Commonly known as:  NAMENDA XR TITRATION PACK  As directed     timolol 0.5 % ophthalmic solution  Commonly known as:  TIMOPTIC  Apply to eye.        Review of Systems:  Review of Systems  Constitutional: Negative for fever, chills and malaise/fatigue.  HENT: Negative for congestion.   Eyes: Negative for blurred vision.  Respiratory: Negative  for cough and shortness of breath.   Cardiovascular: Negative for chest pain and leg swelling.  Gastrointestinal: Negative for abdominal pain.  Genitourinary: Negative for dysuria.  Musculoskeletal: Negative for falls.  Skin: Negative for rash.  Neurological: Positive for dizziness. Negative for weakness.  Endo/Heme/Allergies: Bruises/bleeds easily.  Psychiatric/Behavioral: Positive for memory loss. Negative for depression.    Health Maintenance  Topic Date Due  . TETANUS/TDAP  10/03/1935  . ZOSTAVAX  10/02/1976  . PNA vac Low Risk Adult (2 of 2 - PPSV23) 04/15/2015  . INFLUENZA VACCINE  11/28/2015  . DEXA SCAN  Completed    Physical Exam: Filed Vitals:   08/18/15 0825  BP: 120/62  Pulse: 53  Temp: 98.1 F (36.7 C)  TempSrc: Oral  Height: 5' (1.524 m)  Weight: 156 lb (70.761 kg)  SpO2: 95%   Body mass index is 30.47 kg/(m^2). Physical Exam  Constitutional: She appears well-developed and well-nourished.  HENT:  halitosis  Cardiovascular: Normal rate, regular rhythm, normal heart sounds and intact distal pulses.   Pulmonary/Chest:  Effort normal and breath sounds normal. No respiratory distress.  Abdominal: Bowel sounds are normal.  Neurological: She is alert.  Oriented to person only  Skin: Skin is warm and dry.  Psychiatric: She has a normal mood and affect.    Labs reviewed: Basic Metabolic Panel:  Recent Labs  13/08/65 1002  NA 142  K 5.2  CL 102  CO2 26  GLUCOSE 130*  BUN 20  CREATININE 1.20*  CALCIUM 9.2   Liver Function Tests: No results for input(s): AST, ALT, ALKPHOS, BILITOT, PROT, ALBUMIN in the last 8760 hours. No results for input(s): LIPASE, AMYLASE in the last 8760 hours. No results for input(s): AMMONIA in the last 8760 hours. CBC:  Recent Labs  12/13/14 1002  WBC 7.6  NEUTROABS 4.5  HCT 33.7*  MCV 98*  PLT 176   Lipid Panel: No results for input(s): CHOL, HDL, LDLCALC, TRIG, CHOLHDL, LDLDIRECT in the last 8760 hours. Lab Results  Component Value Date   HGBA1C 6.0 08/10/2012    Assessment/Plan 1. Dizziness -my suspicion now is that it is related to her multiple glaucoma drops--asked her son to discuss it with her ophthalmologist  2. Alzheimer's dementia without behavioral disturbance, unspecified timing of dementia onset -d/c namenda XR -she is not getting benefit from medication taking it once every other day or once a week at times based on what's going on with her--it's a daily med, so son agrees with discontinuing altogether  3. Glaucoma -advised son to discuss drops with ophtho to see if they can be redistributed to help with dizziness -just had her eye exam last week  4. Chronic constipation -cont to work on fluids, walking, and being sure she goes once every 3 days -advised against sitting there for an hour or taking massive amts of prunes   5. Need for prophylactic vaccination and inoculation against influenza -flu shot finally given today  Labs/tests ordered:  No orders of the defined types were placed in this encounter.    Next appt:  6 mos for med mgt  with Shanda Bumps and pneumovax  Breyden Jeudy L. Lona Six, D.O. Geriatrics [facility] [address], [place] [address] Cell Phone (Mon-Fri 8am-5pm):  [phone] On Call:  [phone] & follow prompts after 5pm & weekends Office Phone:  [phone] Office Fax:  [phone]

## 2015-08-18 NOTE — Addendum Note (Signed)
Addended by: Lamont SnowballICE, SHARON L on: 11/18/2015 09:27 AM   Modules accepted: Orders

## 2016-02-20 ENCOUNTER — Ambulatory Visit: Payer: Medicare Other | Admitting: Nurse Practitioner

## 2016-03-04 ENCOUNTER — Ambulatory Visit: Payer: Medicare Other | Admitting: Nurse Practitioner

## 2016-03-05 ENCOUNTER — Ambulatory Visit (INDEPENDENT_AMBULATORY_CARE_PROVIDER_SITE_OTHER): Payer: Medicare Other | Admitting: Nurse Practitioner

## 2016-03-05 ENCOUNTER — Encounter: Payer: Self-pay | Admitting: Nurse Practitioner

## 2016-03-05 VITALS — BP 130/72 | HR 66 | Temp 98.0°F | Resp 18 | Ht 60.0 in | Wt 159.0 lb

## 2016-03-05 DIAGNOSIS — Z23 Encounter for immunization: Secondary | ICD-10-CM | POA: Diagnosis not present

## 2016-03-05 DIAGNOSIS — G301 Alzheimer's disease with late onset: Secondary | ICD-10-CM

## 2016-03-05 DIAGNOSIS — F0280 Dementia in other diseases classified elsewhere without behavioral disturbance: Secondary | ICD-10-CM | POA: Diagnosis not present

## 2016-03-05 DIAGNOSIS — H409 Unspecified glaucoma: Secondary | ICD-10-CM | POA: Diagnosis not present

## 2016-03-05 DIAGNOSIS — R42 Dizziness and giddiness: Secondary | ICD-10-CM | POA: Diagnosis not present

## 2016-03-05 DIAGNOSIS — M65331 Trigger finger, right middle finger: Secondary | ICD-10-CM | POA: Diagnosis not present

## 2016-03-05 DIAGNOSIS — M1990 Unspecified osteoarthritis, unspecified site: Secondary | ICD-10-CM | POA: Diagnosis not present

## 2016-03-05 LAB — CBC WITH DIFFERENTIAL/PLATELET
BASOS ABS: 0 {cells}/uL (ref 0–200)
BASOS PCT: 0 %
EOS ABS: 432 {cells}/uL (ref 15–500)
Eosinophils Relative: 6 %
HEMATOCRIT: 32.5 % — AB (ref 35.0–45.0)
Hemoglobin: 10.8 g/dL — ABNORMAL LOW (ref 11.7–15.5)
LYMPHS PCT: 29 %
Lymphs Abs: 2088 cells/uL (ref 850–3900)
MCH: 32.6 pg (ref 27.0–33.0)
MCHC: 33.2 g/dL (ref 32.0–36.0)
MCV: 98.2 fL (ref 80.0–100.0)
MONO ABS: 864 {cells}/uL (ref 200–950)
MPV: 9.9 fL (ref 7.5–12.5)
Monocytes Relative: 12 %
Neutro Abs: 3816 cells/uL (ref 1500–7800)
Neutrophils Relative %: 53 %
Platelets: 178 10*3/uL (ref 140–400)
RBC: 3.31 MIL/uL — ABNORMAL LOW (ref 3.80–5.10)
RDW: 14.5 % (ref 11.0–15.0)
WBC: 7.2 10*3/uL (ref 3.8–10.8)

## 2016-03-05 NOTE — Progress Notes (Signed)
Careteam: Patient Care Team: Sharon SellerJessica K Burnette Valenti, NP as PCP - General (Nurse Practitioner)  Advanced Directive information Does patient have an advance directive?: Yes, Type of Advance Directive: Healthcare Power of [place];Living will  Allergies  Allergen Reactions  . Penicillins Hives and Itching    Chief Complaint  Patient presents with  . Medical Management of Chronic Issues    6 month follow up. Trigger finger; Knee pain, soreness in shoulder due to fall about 6 weeks ago. Some dizziness (son says for years)  . Other    Wants flu vaccine and pnemovax.  . Other    Needs handicap placard forms completed     HPI: Patient is a 82 y.o. female seen in the office today for routine follow up. Pt recently got back into town, stays with sister in [place] for 6 months and then here with son for 6 months.  Pt reports she has been doing well except she had a fall. Has had 2 falls recently in [place];  Larey SeatFell out of bed and hurt her butt bone. 1 week before she came back into down fell when trying to go up the step and found her on her hands and knees. Reports they are sore but still able to walk without increase difficulty.  Trigger finger is locked on right hand Left shoulder sore which is new nut has full ROM. Takes tylenol which helps Chronic arthritis in knees, tylenol also helps this. Uses cane when ambulating.  Has long standing history of dizziness, went to PT but found it to be "situational" Memory is worse.-- did not tolerate aricept or namenda.   Right foot- 2nd toe crosses the great toe- following with podiatrist, no current wound.  Review of Systems:  Review of Systems  Constitutional: Negative for activity change, appetite change, chills and fever.  Eyes: Positive for visual disturbance.  Respiratory: Negative for shortness of breath.   Cardiovascular: Negative for chest pain and leg swelling.  Gastrointestinal: Positive for constipation (controlled on medication). Negative for  abdominal pain and diarrhea.  Genitourinary: Negative for dysuria.  Musculoskeletal: Positive for arthralgias (controlled on tylenol). Negative for joint swelling.  Skin: Negative for wound.  Neurological: Positive for dizziness. Negative for weakness.  Psychiatric/Behavioral: Positive for confusion (memory loss).    Past Medical History:  Diagnosis Date  . Arthritis   . Glaucoma    Past Surgical History:  Procedure Laterality Date  . AMPUTATION FINGER / THUMB Right 1963   index  . EYE SURGERY Bilateral    catracts  . TONSILECTOMY/ADENOIDECTOMY WITH MYRINGOTOMY     as child   Social History:   reports that she has never smoked. She has never used smokeless tobacco. She reports that she does not drink alcohol or use drugs.  Family History  Problem Relation Age of Onset  . Cancer Mother   . Heart disease Father   . Heart disease Brother   . Heart disease Brother   . Heart disease Brother     Medications: Patient's Medications  New Prescriptions   No medications on file  Previous Medications   ACETAMINOPHEN (TYLENOL) 500 MG TABLET    Take 500 mg by mouth once. Take one tab every day.   APRACLONIDINE (IOPIDINE) 0.5 % OPHTHALMIC SOLUTION    1 drop 2 (two) times daily.   CALCIUM GLUCONATE 500 MG TABLET    Take 1 tablet by mouth daily.   DORZOLAMIDE (TRUSOPT) 2 % OPHTHALMIC SOLUTION    1 drop.  GINKGO BILOBA PO    Take by mouth daily.   LATANOPROST (XALATAN) 0.005 % OPHTHALMIC SOLUTION    1 drop at bedtime.   MULTIPLE VITAMINS-MINERALS (CENTRUM SILVER PO)    Take by mouth daily.   OMEGA-3 FATTY ACIDS (FISH OIL PO)    Take by mouth.   TIMOLOL (TIMOPTIC) 0.5 % OPHTHALMIC SOLUTION    Apply to eye.   WOUND DRESSINGS (HYDROGEL) GEL    Apply topically. Apply to right foot, big toe twice daily  Modified Medications   No medications on file  Discontinued Medications   No medications on file     Physical Exam:  Vitals:   03/05/16 1527  BP: 130/72  Pulse: 66  Resp: 18    Temp: 98 F (36.7 C)  TempSrc: Oral  SpO2: 95%  Weight: 159 lb (72.1 kg)  Height: 5' (1.524 m)   Body mass index is 31.05 kg/m.  Physical Exam  Constitutional: She appears well-developed and well-nourished.  HENT:  Mouth/Throat: Oropharynx is clear and moist. No oropharyngeal exudate.  halitosis  Eyes: Conjunctivae are normal.  Neck: Normal range of motion. Neck supple.  Cardiovascular: Normal rate, regular rhythm and normal heart sounds.   Pulmonary/Chest: Effort normal and breath sounds normal. No respiratory distress.  Abdominal: Soft. Bowel sounds are normal. She exhibits no distension.  Musculoskeletal: She exhibits tenderness (to bilateral knees). She exhibits no edema.  Neurological: She is alert.  Oriented to person only  Skin: Skin is warm and dry.  Psychiatric: She has a normal mood and affect.    Labs reviewed: Basic Metabolic Panel: No results for input(s): NA, K, CL, CO2, GLUCOSE, BUN, CREATININE, CALCIUM, MG, PHOS, TSH in the last 8760 hours. Liver Function Tests: No results for input(s): AST, ALT, ALKPHOS, BILITOT, PROT, ALBUMIN in the last 8760 hours. No results for input(s): LIPASE, AMYLASE in the last 8760 hours. No results for input(s): AMMONIA in the last 8760 hours. CBC: No results for input(s): WBC, NEUTROABS, HGB, HCT, MCV, PLT in the last 8760 hours. Lipid Panel: No results for input(s): CHOL, HDL, LDLCALC, TRIG, CHOLHDL, LDLDIRECT in the last 8760 hours. TSH: No results for input(s): TSH in the last 8760 hours. A1C: Lab Results  Component Value Date   HGBA1C 6.0 08/10/2012     Assessment/Plan 1. Arthritis -to cont tylenol in am, may take additional dose midday to help with pain. Does not like to take a lot of medication.  Also can use biofreeze to affected areas.  - CBC with Differential/Platelets - COMPLETE METABOLIC PANEL WITH GFR -handicap form completed.   2. Late onset Alzheimer's disease without behavioral disturbance Living  with son and daughter who provide supportive care. Progressive memory loss noted. She is still very independent despite the progression of disease. Did not tolerate dementia related medication   3. Trigger middle finger of right hand - Ambulatory referral to Orthopedics  4. Dizziness Lifestyle modification encouraged. Slowly going from sitting to standing and changing positions.   5. Glaucoma, unspecified glaucoma type, unspecified laterality -ongoing follow up by ophthalmology, ophthalmologist does not feel like the eye drops are associated with dizziness.   6. Encounter for immunization - Flu Vaccine QUAD 36+ mos IM  7. Need for pneumococcal vaccine - Pneumococcal polysaccharide vaccine 23-valent greater than or equal to 2yo subcutaneous/IM  Follow up in 6 months, sooner if needed Tyreisha Ungar K. Biagio BorgEubanks, AGNP  [facility] [phone](Monday-Friday 8 am - 5 pm) [phone] (after hours)

## 2016-03-06 LAB — COMPLETE METABOLIC PANEL WITH GFR
ALT: 11 U/L (ref 6–29)
AST: 20 U/L (ref 10–35)
Albumin: 3.6 g/dL (ref 3.6–5.1)
Alkaline Phosphatase: 54 U/L (ref 33–130)
BILIRUBIN TOTAL: 0.3 mg/dL (ref 0.2–1.2)
BUN: 19 mg/dL (ref 7–25)
CALCIUM: 9.2 mg/dL (ref 8.6–10.4)
CO2: 25 mmol/L (ref 20–31)
CREATININE: 1.27 mg/dL — AB (ref 0.60–0.88)
Chloride: 107 mmol/L (ref 98–110)
GFR, EST AFRICAN AMERICAN: 40 mL/min — AB (ref >=60)
GFR, Est Non African American: 35 mL/min — ABNORMAL LOW (ref >=60)
Glucose, Bld: 89 mg/dL (ref 65–99)
Potassium: 4.5 mmol/L (ref 3.5–5.3)
Sodium: 141 mmol/L (ref 135–146)
TOTAL PROTEIN: 6.5 g/dL (ref 6.1–8.1)

## 2016-03-27 ENCOUNTER — Ambulatory Visit (INDEPENDENT_AMBULATORY_CARE_PROVIDER_SITE_OTHER): Payer: Medicare Other | Admitting: Orthopedic Surgery

## 2016-03-27 ENCOUNTER — Encounter (INDEPENDENT_AMBULATORY_CARE_PROVIDER_SITE_OTHER): Payer: Self-pay | Admitting: Orthopedic Surgery

## 2016-03-27 DIAGNOSIS — M65331 Trigger finger, right middle finger: Secondary | ICD-10-CM

## 2016-03-27 NOTE — Progress Notes (Addendum)
Office Visit Note   Patient: Kathy Mcclain           Date of Birth: 06/16/1914           MRN: [id number] Visit Date: 03/27/2016 Requested by: Sharon SellerJessica K Eubanks, NP [address] [place] [id number] PCP: Sharon SellerEUBANKS, JESSICA K, NP  Subjective: Chief Complaint  Patient presents with  . Right Middle Finger - Pain    HPI Kathy Mcclain is a 82 year old right-hand-dominant female with four-month history of locked right third finger.  She's had a previous industrial accident which affected her right index finger.  It used to be that she could straighten the finger out but for the past 4 months it is been locked in that position.  She has tried taking some over-the-counter medicines and tried working it out on her own but has not been able to straighten the finger out in 4 months              Review of Systems All systems reviewed are negative as they relate to the chief complaint within the history of present illness.  Patient denies  fevers or chills.    Assessment & Plan: Visit Diagnoses:  1. Trigger finger, right middle finger     Plan: Impression is right third trigger finger with PIP flexion contracture.  This is a complex problem with the PIP flexion contracture.  Plan is to refer her to Dr. Mina MarbleWeingold for consideration of capsulotomy of the PIP joint along with trigger finger release.  I will see her back as needed  Follow-Up Instructions: Return if symptoms worsen or fail to improve.   Orders:  Orders Placed This Encounter  Procedures  . Ambulatory referral to Orthopedic Surgery   No orders of the defined types were placed in this encounter.     Procedures: No procedures performed   Clinical Data: No additional findings.  Objective: Vital Signs: There were no vitals taken for this visit.  Physical Exam  Constitutional: She appears well-developed.  HENT:  Head: Normocephalic.  Eyes: EOM are normal.  Neck: Normal range of motion.  Cardiovascular: Normal rate.     Pulmonary/Chest: Effort normal.  Neurological: She is alert.  Skin: Skin is warm.  Psychiatric: She has a normal mood and affect.    Ortho Exam examination of the right hand demonstrates previously partially indicated index finger.  She has a PIP flexion contracture of 90 on the third finger.  There is some tenderness at the A1 pulley of the third finger.  She has palpable radial pulse.  She definitely appears younger than 999  Specialty Comments:  No specialty comments available.  Imaging: No results found.   PMFS History: Patient Active Problem List   Diagnosis Date Noted  . Alzheimer's disease 07/05/2013  . Chronic constipation 07/05/2013  . Generalized osteoarthritis of multiple sites 07/05/2013  . Benign paroxysmal positional vertigo 07/05/2013  . Allergic rhinitis 07/05/2013  . Memory loss 03/31/2013  . Other and unspecified hyperlipidemia 03/31/2013  . Unspecified constipation 03/31/2013  . Dry skin 03/31/2013  . Hyperglycemia 09/22/2012  . Arthritis 08/10/2012   Past Medical History:  Diagnosis Date  . Arthritis   . Glaucoma     Family History  Problem Relation Age of Onset  . Cancer Mother   . Heart disease Father   . Heart disease Brother   . Heart disease Brother   . Heart disease Brother     Past Surgical History:  Procedure Laterality Date  .  AMPUTATION FINGER / THUMB Right 1963   index  . EYE SURGERY Bilateral    catracts  . TONSILECTOMY/ADENOIDECTOMY WITH MYRINGOTOMY     as child   Social History   Occupational History  . Not on file.   Social History Main Topics  . Smoking status: Never Smoker  . Smokeless tobacco: Never Used  . Alcohol use No  . Drug use: No  . Sexual activity: No

## 2016-04-18 ENCOUNTER — Other Ambulatory Visit: Payer: Self-pay | Admitting: Orthopedic Surgery

## 2016-05-03 ENCOUNTER — Telehealth: Payer: Self-pay | Admitting: Nurse Practitioner

## 2016-05-03 NOTE — Telephone Encounter (Signed)
Left msg asking pt to call and schedule AWV. Explained we have openings on 1/9 and 1/11. VDM (DD)

## 2016-05-09 ENCOUNTER — Ambulatory Visit (INDEPENDENT_AMBULATORY_CARE_PROVIDER_SITE_OTHER): Payer: Medicare Other

## 2016-05-09 VITALS — BP 162/78 | HR 76 | Temp 98.3°F | Ht 60.0 in | Wt 156.6 lb

## 2016-05-09 DIAGNOSIS — Z0000 Encounter for general adult medical examination without abnormal findings: Secondary | ICD-10-CM

## 2016-05-09 NOTE — Progress Notes (Signed)
Quick Notes   Health Maintenance:  UTD on maintenance    Abnormal Screen: Unable to do MMSE, due to Alzheimer's disease.     Patient Concerns:  None    Nurse Concerns:  None

## 2016-05-09 NOTE — Patient Instructions (Addendum)
Kathy Mcclain , Thank you for taking time to come for your Medicare Wellness Visit. I appreciate your ongoing commitment to your health goals. Please review the following plan we discussed and let me know if I can assist you in the future.   These are the goals we discussed: Goals    . <enter goal here>          Starting 05/09/16, I will maintain my current lifestyle.        This is a list of the screening recommended for you and due dates:  Health Maintenance  Topic Date Due  . Shingles Vaccine  04/29/2023*  . Tetanus Vaccine  04/29/2023*  . Flu Shot  Completed  . DEXA scan (bone density measurement)  Completed  . Pneumonia vaccines  Completed  *Topic was postponed. The date shown is not the original due date.  Preventive Care for Adults  A healthy lifestyle and preventive care can promote health and wellness. Preventive health guidelines for adults include the following key practices.  . A routine yearly physical is a good way to check with your health care provider about your health and preventive screening. It is a chance to share any concerns and updates on your health and to receive a thorough exam.  . Visit your dentist for a routine exam and preventive care every 6 months. Brush your teeth twice a day and floss once a day. Good oral hygiene prevents tooth decay and gum disease.  . The frequency of eye exams is based on your age, health, family medical history, use  of contact lenses, and other factors. Follow your health care provider's ecommendations for frequency of eye exams.  . Eat a healthy diet. Foods like vegetables, fruits, whole grains, low-fat dairy products, and lean protein foods contain the nutrients you need without too many calories. Decrease your intake of foods high in solid fats, added sugars, and salt. Eat the right amount of calories for you. Get information about a proper diet from your health care provider, if necessary.  . Regular physical exercise is one  of the most important things you can do for your health. Most adults should get at least 150 minutes of moderate-intensity exercise (any activity that increases your heart rate and causes you to sweat) each week. In addition, most adults need muscle-strengthening exercises on 2 or more days a week.  Silver Sneakers may be a benefit available to you. To determine eligibility, you may visit the website: www.silversneakers.com or contact program at [phone] Mon-Fri between 8AM-8PM.   . Maintain a healthy weight. The body mass index (BMI) is a screening tool to identify possible weight problems. It provides an estimate of body fat based on height and weight. Your health care provider can find your BMI and can help you achieve or maintain a healthy weight.   For adults 20 years and older: ? A BMI below 18.5 is considered underweight. ? A BMI of 18.5 to 24.9 is normal. ? A BMI of 25 to 29.9 is considered overweight. ? A BMI of 30 and above is considered obese.   . Maintain normal blood lipids and cholesterol levels by exercising and minimizing your intake of saturated fat. Eat a balanced diet with plenty of fruit and vegetables. Blood tests for lipids and cholesterol should begin at age 81 and be repeated every 5 years. If your lipid or cholesterol levels are high, you are over 50, or you are at high risk for heart disease, you may  need your cholesterol levels checked more frequently. Ongoing high lipid and cholesterol levels should be treated with medicines if diet and exercise are not working.  . If you smoke, find out from your health care provider how to quit. If you do not use tobacco, please do not start.  . If you choose to drink alcohol, please do not consume more than 2 drinks per day. One drink is considered to be 12 ounces (355 mL) of beer, 5 ounces (148 mL) of wine, or 1.5 ounces (44 mL) of liquor.  . If you are 58-24 years old, ask your health care provider if you should take aspirin  to prevent strokes.  . Use sunscreen. Apply sunscreen liberally and repeatedly throughout the day. You should seek shade when your shadow is shorter than you. Protect yourself by wearing long sleeves, pants, a wide-brimmed hat, and sunglasses year round, whenever you are outdoors.  . Once a month, do a whole body skin exam, using a mirror to look at the skin on your back. Tell your health care provider of new moles, moles that have irregular borders, moles that are larger than a pencil eraser, or moles that have changed in shape or color.

## 2016-05-09 NOTE — Progress Notes (Signed)
Subjective:   Kathy Mcclain is a 83 y.o. female who presents for an Initial Medicare Annual Wellness Visit.  Review of Systems     Cardiac Risk Factors include: advanced age (>6655men, 51>65 women)     Objective:    Today's Vitals   05/09/16 1458  BP: (!) 162/78  Pulse: 76  Temp: 98.3 F (36.8 C)  TempSrc: Oral  SpO2: 98%  Weight: 156 lb 9.6 oz (71 kg)  Height: 5' (1.524 m)  PainSc: 0-No pain   Body mass index is 30.58 kg/m.   Current Medications (verified) Outpatient Encounter Prescriptions as of 05/09/2016  Medication Sig  . aspirin 81 MG chewable tablet Chew 81 mg by mouth daily.  . calcium gluconate 500 MG tablet Take 1 tablet by mouth daily.  Marland Kitchen. latanoprost (XALATAN) 0.005 % ophthalmic solution 1 drop at bedtime.  . Omega-3 Fatty Acids (FISH OIL PO) Take by mouth.  . timolol (TIMOPTIC) 0.5 % ophthalmic solution Apply to eye.  . [DISCONTINUED] Multiple Vitamins-Minerals (CENTRUM SILVER PO) Take by mouth daily.  . [DISCONTINUED] acetaminophen (TYLENOL) 500 MG tablet Take 500 mg by mouth once. Take one tab every day.  . [DISCONTINUED] apraclonidine (IOPIDINE) 0.5 % ophthalmic solution 1 drop 2 (two) times daily.  . [DISCONTINUED] dorzolamide (TRUSOPT) 2 % ophthalmic solution 1 drop.  . [DISCONTINUED] GINKGO BILOBA PO Take by mouth daily.  . [DISCONTINUED] Wound Dressings (HYDROGEL) GEL Apply topically. Apply to right foot, big toe twice daily   No facility-administered encounter medications on file as of 05/09/2016.     Allergies (verified) Penicillins   History: Past Medical History:  Diagnosis Date  . Allergic rhinitis 07/05/2013  . Alzheimer's disease 07/05/2013  . Arthritis   . Benign paroxysmal positional vertigo 07/05/2013  . Generalized osteoarthritis of multiple sites 07/05/2013  . Glaucoma   . Hyperglycemia 09/22/2012  . Memory loss 03/31/2013  . Unspecified constipation 03/31/2013   Past Surgical History:  Procedure Laterality Date  . AMPUTATION FINGER /  THUMB Right 1963   index  . EYE SURGERY Bilateral    catracts  . TONSILECTOMY/ADENOIDECTOMY WITH MYRINGOTOMY     as child   Family History  Problem Relation Age of Onset  . Cancer Mother   . Heart disease Father   . Heart disease Brother   . Heart disease Brother   . Heart disease Brother    Social History   Occupational History  . Not on file.   Social History Main Topics  . Smoking status: Never Smoker  . Smokeless tobacco: Never Used  . Alcohol use No  . Drug use: No  . Sexual activity: No    Tobacco Counseling Counseling given: No   Activities of Daily Living In your present state of health, do you have any difficulty performing the following activities: 05/09/2016  Hearing? Y  Vision? Y  Difficulty concentrating or making decisions? Y  Walking or climbing stairs? Y  Dressing or bathing? N  Doing errands, shopping? Y  Preparing Food and eating ? Y  Using the Toilet? N  In the past six months, have you accidently leaked urine? Y  Do you have problems with loss of bowel control? Y  Managing your Medications? Y  Managing your Finances? Y  Housekeeping or managing your Housekeeping? Y  Some recent data might be hidden    Immunizations and Health Maintenance Immunization History  Administered Date(s) Administered  . Influenza Split 04/29/2013  . Influenza,inj,Quad PF,36+ Mos 04/14/2014, 03-14-202017, 03/05/2016  .  Pneumococcal Conjugate-13 04/14/2014  . Pneumococcal Polysaccharide-23 03/05/2016   There are no preventive care reminders to display for this patient.  Patient Care Team: Sharon Seller, NP as PCP - General (Nurse Practitioner)  Indicate any recent Medical Services you may have received from other than [facility] providers in the past year (date may be approximate).     Assessment:   This is a routine wellness examination for Kathy Mcclain.   Hearing/Vision screen  Hearing Screening   125Hz  250Hz  500Hz  1000Hz  2000Hz  3000Hz  4000Hz  6000Hz  8000Hz   Right  ear:   0 0 0  0    Left ear:   0 0 0  0    Comments: Per pt's son, she has not had a hearing screen. Failed office hearing. Pt will most likely not pursue hearing aids.   Vision Screening Comments: Last eye exam, done at [facility] with Dr. Verdis Prime, last week (05/02/16)  Dietary issues and exercise activities discussed: Current Exercise Habits: Structured exercise class (Pt goes to the [facility] and they have a chair exercise program), Type of exercise: stretching;calisthenics, Time (Minutes): 20, Frequency (Times/Week): 3, Weekly Exercise (Minutes/Week): 60, Intensity: Mild  Goals    . <enter goal here>          Starting 05/09/16, I will maintain my current lifestyle.       Depression Screen PHQ 2/9 Scores 05/09/2016 04/14/2014 09/22/2012  PHQ - 2 Score 0 0 0    Fall Risk Fall Risk  05/09/2016 03/05/2016 03-May-202017 12/13/2014 04/14/2014  Falls in the past year? Yes Yes No No No  Number falls in past yr: (No Data) 2 or more - - -  Injury with Fall? Yes No - - -  Risk for fall due to : Impaired balance/gait - - - -    Cognitive Function: MMSE - Mini Mental State Exam 05/09/2016 09/22/2012 09/22/2012  Not completed: Unable to complete - -  Orientation to time - 4 4  Orientation to Place - 5 5  Registration - 3 3  Attention/ Calculation - 3 3  Recall - 2 2  Language- name 2 objects - 2 2  Language- repeat - 1 1  Language- follow 3 step command - 3 3  Language- read & follow direction - 1 1  Write a sentence - 1 1  Copy design - 0 -  Total score - 25 -        Screening Tests Health Maintenance  Topic Date Due  . ZOSTAVAX  04/29/2023 (Originally 10/02/1974)  . TETANUS/TDAP  04/29/2023 (Originally 10/03/1933)  . INFLUENZA VACCINE  Completed  . DEXA SCAN  Completed  . PNA vac Low Risk Adult  Completed      Plan:    I have personally reviewed and addressed the Medicare Annual Wellness questionnaire and have noted the following in the patient's chart:  A. Medical and social  history B. Use of alcohol, tobacco or illicit drugs  C. Current medications and supplements D. Functional ability and status E.  Nutritional status F.  Physical activity G. Advance directives H. List of other physicians I.  Hospitalizations, surgeries, and ER visits in previous 12 months J.  Vitals K. Screenings to include hearing, vision, cognitive, depression L. Referrals and appointments - none  In addition, I have reviewed and discussed with patient certain preventive protocols, quality metrics, and best practice recommendations. A written personalized care plan for preventive services as well as general preventive health recommendations were provided to patient.  See  attached scanned questionnaire for additional information.   Signed,   Nilda Calamity, LPN Health Advisor  I reviewed health advisors note, was available for consultation and agree with documentation and plan.  Janene Harvey. Biagio Borg  [facility] [phone] 8 am - 5 pm) [phone] (after hours)

## 2016-05-16 ENCOUNTER — Ambulatory Visit (HOSPITAL_BASED_OUTPATIENT_CLINIC_OR_DEPARTMENT_OTHER): Admit: 2016-05-16 | Payer: Medicare Other | Admitting: Orthopedic Surgery

## 2016-05-16 ENCOUNTER — Encounter (HOSPITAL_BASED_OUTPATIENT_CLINIC_OR_DEPARTMENT_OTHER): Payer: Self-pay

## 2016-05-16 SURGERY — RELEASE, A1 PULLEY, FOR TRIGGER FINGER
Anesthesia: Choice | Laterality: Right

## 2016-06-10 ENCOUNTER — Ambulatory Visit: Payer: Self-pay | Admitting: Nurse Practitioner

## 2016-06-11 ENCOUNTER — Ambulatory Visit (INDEPENDENT_AMBULATORY_CARE_PROVIDER_SITE_OTHER): Payer: Medicare Other | Admitting: Nurse Practitioner

## 2016-06-11 ENCOUNTER — Encounter: Payer: Self-pay | Admitting: Nurse Practitioner

## 2016-06-11 VITALS — BP 128/72 | HR 65 | Temp 97.7°F | Resp 17 | Ht 60.0 in | Wt 156.8 lb

## 2016-06-11 DIAGNOSIS — N183 Chronic kidney disease, stage 3 unspecified: Secondary | ICD-10-CM

## 2016-06-11 DIAGNOSIS — M65331 Trigger finger, right middle finger: Secondary | ICD-10-CM

## 2016-06-11 DIAGNOSIS — G301 Alzheimer's disease with late onset: Secondary | ICD-10-CM | POA: Diagnosis not present

## 2016-06-11 DIAGNOSIS — F0280 Dementia in other diseases classified elsewhere without behavioral disturbance: Secondary | ICD-10-CM | POA: Diagnosis not present

## 2016-06-11 DIAGNOSIS — J3089 Other allergic rhinitis: Secondary | ICD-10-CM | POA: Diagnosis not present

## 2016-06-11 LAB — CBC WITH DIFFERENTIAL/PLATELET
BASOS ABS: 0 {cells}/uL (ref 0–200)
BASOS PCT: 0 %
EOS ABS: 415 {cells}/uL (ref 15–500)
Eosinophils Relative: 5 %
HEMATOCRIT: 34.4 % — AB (ref 35.0–45.0)
HEMOGLOBIN: 11.2 g/dL — AB (ref 11.7–15.5)
Lymphocytes Relative: 26 %
Lymphs Abs: 2158 cells/uL (ref 850–3900)
MCH: 31.6 pg (ref 27.0–33.0)
MCHC: 32.6 g/dL (ref 32.0–36.0)
MCV: 97.2 fL (ref 80.0–100.0)
MONO ABS: 996 {cells}/uL — AB (ref 200–950)
MONOS PCT: 12 %
MPV: 9.4 fL (ref 7.5–12.5)
Neutro Abs: 4731 cells/uL (ref 1500–7800)
Neutrophils Relative %: 57 %
PLATELETS: 197 10*3/uL (ref 140–400)
RBC: 3.54 MIL/uL — ABNORMAL LOW (ref 3.80–5.10)
RDW: 14.4 % (ref 11.0–15.0)
WBC: 8.3 10*3/uL (ref 3.8–10.8)

## 2016-06-11 LAB — BASIC METABOLIC PANEL WITH GFR
BUN: 20 mg/dL (ref 7–25)
CALCIUM: 9.2 mg/dL (ref 8.6–10.4)
CHLORIDE: 105 mmol/L (ref 98–110)
CO2: 26 mmol/L (ref 20–31)
CREATININE: 1.38 mg/dL — AB (ref 0.60–0.88)
GFR, EST NON AFRICAN AMERICAN: 32 mL/min — AB (ref >=60)
GFR, Est African American: 36 mL/min — ABNORMAL LOW (ref >=60)
GLUCOSE: 104 mg/dL — AB (ref 65–99)
Potassium: 5.1 mmol/L (ref 3.5–5.3)
Sodium: 140 mmol/L (ref 135–146)

## 2016-06-11 NOTE — Progress Notes (Signed)
Careteam: Patient Care Team: Lauree Chandler, NP as PCP - General (Nurse Practitioner)  Advanced Directive information Does Patient Have a Medical Advance Directive?: No  Allergies  Allergen Reactions  . Penicillins Hives and Itching    Chief Complaint  Patient presents with  . Acute Visit    Follow up on kidneys      HPI: Patient is a 83 y.o. female seen in the office today to review lab work.  Pt with hx of CKD, memory loss, OA, allergies.  Here today to review lab work from last visit. Pt eating and drinking well.  Son reports pt is staying with him due to last move from sister to his house was rough on patient. She became more confused and has not totally recovered from the move.  Reports increase in saliva, spits a lot No increase in cough or congestion.  Did not choose to do anything about trigger finger because there would be down time and rehab needed which son does not feel like she would do well with this.  Pt complains of dizziness that improves with breakfast, this is a chronic complaint.  Review of Systems:  Review of Systems  Unable to perform ROS: Dementia    Past Medical History:  Diagnosis Date  . Allergic rhinitis 07/05/2013  . Alzheimer's disease 07/05/2013  . Arthritis   . Benign paroxysmal positional vertigo 07/05/2013  . Generalized osteoarthritis of multiple sites 07/05/2013  . Glaucoma   . Hyperglycemia 09/22/2012  . Memory loss 03/31/2013  . Unspecified constipation 03/31/2013   Past Surgical History:  Procedure Laterality Date  . AMPUTATION FINGER / THUMB Right 1963   index  . EYE SURGERY Bilateral    catracts  . TONSILECTOMY/ADENOIDECTOMY WITH MYRINGOTOMY     as child   Social History:   reports that she has never smoked. She has never used smokeless tobacco. She reports that she does not drink alcohol or use drugs.  Family History  Problem Relation Age of Onset  . Cancer Mother   . Heart disease Father   . Heart disease Brother   .  Heart disease Brother   . Heart disease Brother     Medications: Patient's Medications  New Prescriptions   No medications on file  Previous Medications   ASPIRIN 81 MG CHEWABLE TABLET    Chew 81 mg by mouth daily.   CALCIUM GLUCONATE 500 MG TABLET    Take 1 tablet by mouth daily.   LATANOPROST (XALATAN) 0.005 % OPHTHALMIC SOLUTION    1 drop at bedtime.   OMEGA-3 FATTY ACIDS (FISH OIL PO)    Take by mouth.   TIMOLOL (TIMOPTIC) 0.5 % OPHTHALMIC SOLUTION    Apply to eye.  Modified Medications   No medications on file  Discontinued Medications   No medications on file     Physical Exam:  Vitals:   06/11/16 1452  BP: 128/72  Pulse: 65  Resp: 17  Temp: 97.7 F (36.5 C)  TempSrc: Oral  SpO2: 91%  Weight: 156 lb 12.8 oz (71.1 kg)  Height: 5' (1.524 m)   Body mass index is 30.62 kg/m.  Physical Exam  Constitutional: She appears well-developed and well-nourished.  HENT:  Mouth/Throat: Oropharynx is clear and moist. No oropharyngeal exudate.  halitosis  Eyes: Conjunctivae are normal.  Neck: Normal range of motion. Neck supple.  Cardiovascular: Normal rate, regular rhythm and normal heart sounds.   Pulmonary/Chest: Effort normal and breath sounds normal. No respiratory distress.  Abdominal: Soft. Bowel sounds are normal. She exhibits no distension.  Musculoskeletal: She exhibits tenderness (to bilateral knees). She exhibits no edema.  Neurological: She is alert.  Oriented to person only  Skin: Skin is warm and dry.  Psychiatric: She has a normal mood and affect.    Labs reviewed: Basic Metabolic Panel:  Recent Labs  03/05/16 1615  NA 141  K 4.5  CL 107  CO2 25  GLUCOSE 89  BUN 19  CREATININE 1.27*  CALCIUM 9.2   Liver Function Tests:  Recent Labs  03/05/16 1615  AST 20  ALT 11  ALKPHOS 54  BILITOT 0.3  PROT 6.5  ALBUMIN 3.6   No results for input(s): LIPASE, AMYLASE in the last 8760 hours. No results for input(s): AMMONIA in the last 8760  hours. CBC:  Recent Labs  03/05/16 1615  WBC 7.2  NEUTROABS 3,816  HGB 10.8*  HCT 32.5*  MCV 98.2  PLT 178   Lipid Panel: No results for input(s): CHOL, HDL, LDLCALC, TRIG, CHOLHDL, LDLDIRECT in the last 8760 hours. TSH: No results for input(s): TSH in the last 8760 hours. A1C: Lab Results  Component Value Date   HGBA1C 6.0 08/10/2012     Assessment/Plan 1. CKD (chronic kidney disease) stage 3, GFR 30-59 ml/min -not on any nephrotoxic medications, Encourage proper hydration and to avoid NSAIDS (Aleve, Advil, Motrin, Ibuprofen)  - BMP with eGFR - CBC with Differential/Platelets  2. Trigger middle finger of right hand Stable, no intervention at this time.   3. Non-seasonal allergic rhinitis, unspecified chronicity, unspecified trigger May use Claritin 10 mg daily as needed   4. Late onset Alzheimer's disease without behavioral disturbance Advanced dementia, no significant changes from previous visit. pts son notes worsening memory loss with move and changes to her routine. Plan to keep her in one place for now to limit changes.   To follow up in 3 months  Aadon Gorelik K. Harle Battiest  [facility] [phone] 8 am - 5 pm) [phone] (after hours)

## 2016-06-11 NOTE — Patient Instructions (Addendum)
Encourage proper hydration and to avoid NSAIDS (Aleve, Advil, Motrin, Ibuprofen)   May use Claritin (loratadine)  10 mg daily

## 2016-09-15 ENCOUNTER — Emergency Department (HOSPITAL_COMMUNITY): Payer: Medicare Other

## 2016-09-15 ENCOUNTER — Encounter (HOSPITAL_COMMUNITY): Payer: Self-pay | Admitting: *Deleted

## 2016-09-15 ENCOUNTER — Emergency Department (HOSPITAL_COMMUNITY)
Admission: EM | Admit: 2016-09-15 | Discharge: 2016-09-15 | Disposition: A | Discharge status: Home / Self Care | Payer: Medicare Other | Attending: Emergency Medicine | Admitting: Emergency Medicine

## 2016-09-15 DIAGNOSIS — S0083XA Contusion of other part of head, initial encounter: Secondary | ICD-10-CM | POA: Insufficient documentation

## 2016-09-15 DIAGNOSIS — Y999 Unspecified external cause status: Secondary | ICD-10-CM | POA: Insufficient documentation

## 2016-09-15 DIAGNOSIS — G309 Alzheimer's disease, unspecified: Secondary | ICD-10-CM | POA: Insufficient documentation

## 2016-09-15 DIAGNOSIS — Z7982 Long term (current) use of aspirin: Secondary | ICD-10-CM | POA: Insufficient documentation

## 2016-09-15 DIAGNOSIS — W19XXXA Unspecified fall, initial encounter: Secondary | ICD-10-CM

## 2016-09-15 DIAGNOSIS — W228XXA Striking against or struck by other objects, initial encounter: Secondary | ICD-10-CM | POA: Diagnosis not present

## 2016-09-15 DIAGNOSIS — Y929 Unspecified place or not applicable: Secondary | ICD-10-CM | POA: Insufficient documentation

## 2016-09-15 DIAGNOSIS — Y939 Activity, unspecified: Secondary | ICD-10-CM | POA: Diagnosis not present

## 2016-09-15 NOTE — Discharge Instructions (Signed)
As discussed, your evaluation today has been largely reassuring.  But, it is important that you monitor your condition carefully, and do not hesitate to return to the ED if you develop new, or concerning changes in your condition. ? ?Otherwise, please follow-up with your physician for appropriate ongoing care. ? ?

## 2016-09-15 NOTE — ED Triage Notes (Signed)
Pt fell Thursday and hit rt side of head, Friday CT scan done, no results, fell again this AM hitting top forehead on night table. No LOC

## 2016-09-15 NOTE — ED Provider Notes (Signed)
[facility]-EMERGENCY DEPT Provider Note   CSN: [id number] Arrival date & time: 09/15/16  4098     History   Chief Complaint Chief Complaint  Patient presents with  . Fall    HPI Kathy Mcclain is a 83 y.o. female.  HPI  Patient presents after a fall. Patient is awake and alert, has poor hearing, but otherwise is appropriately interactive. History is provided by her and her son. They note that she has had several falls recently, including one 3 days ago, but after fall overnight, with new evidence of hematoma on her forehead, some description of sore pain in that area, they present for evaluation. She denies other complaints including neck pain, weakness in her extremities, paresthesia in her extremities, vision changes. This is corroborated by the son.   Past Medical History:  Diagnosis Date  . Allergic rhinitis 07/05/2013  . Alzheimer's disease 07/05/2013  . Arthritis   . Benign paroxysmal positional vertigo 07/05/2013  . Generalized osteoarthritis of multiple sites 07/05/2013  . Glaucoma   . Hyperglycemia 09/22/2012  . Memory loss 03/31/2013  . Unspecified constipation 03/31/2013    Patient Active Problem List   Diagnosis Date Noted  . Alzheimer's disease 07/05/2013  . Chronic constipation 07/05/2013  . Generalized osteoarthritis of multiple sites 07/05/2013  . Benign paroxysmal positional vertigo 07/05/2013  . Allergic rhinitis 07/05/2013  . Memory loss 03/31/2013  . Other and unspecified hyperlipidemia 03/31/2013  . Unspecified constipation 03/31/2013  . Dry skin 03/31/2013  . Hyperglycemia 09/22/2012  . Arthritis 08/10/2012    Past Surgical History:  Procedure Laterality Date  . AMPUTATION FINGER / THUMB Right 1963   index  . EYE SURGERY Bilateral    catracts  . TONSILECTOMY/ADENOIDECTOMY WITH MYRINGOTOMY     as child    OB History    No data available       Home Medications    Prior to Admission medications   Medication Sig Start Date End Date Taking?  Authorizing Provider  aspirin 81 MG chewable tablet Chew 81 mg by mouth daily.    [provider]  calcium gluconate 500 MG tablet Take 1 tablet by mouth daily.    [provider]  latanoprost (XALATAN) 0.005 % ophthalmic solution 1 drop at bedtime.    [provider]  Omega-3 Fatty Acids (FISH OIL PO) Take by mouth.    [provider]  timolol (TIMOPTIC) 0.5 % ophthalmic solution Apply to eye. 08/03/14   [provider]    Family History Family History  Problem Relation Age of Onset  . Cancer Mother   . Heart disease Father   . Heart disease Brother   . Heart disease Brother   . Heart disease Brother     Social History Social History  Substance Use Topics  . Smoking status: Never Smoker  . Smokeless tobacco: Never Used  . Alcohol use No     Allergies   Penicillins   Review of Systems Review of Systems  Constitutional:       Per HPI, otherwise negative  HENT:       Per HPI, otherwise negative  Respiratory:       Per HPI, otherwise negative  Cardiovascular:       Per HPI, otherwise negative  Gastrointestinal: Negative for vomiting.  Endocrine:       Negative aside from HPI  Genitourinary:       Neg aside from HPI   Musculoskeletal:       Per HPI,  otherwise negative  Skin: Positive for color change and wound.  Neurological: Negative for syncope.     Physical Exam Updated Vital Signs BP (!) 115/53 (BP Location: Right Arm)   Pulse 66   Temp 97.7 F (36.5 C) (Oral)   Resp 18   SpO2 98%   Physical Exam  Constitutional: She is oriented to person, place, and time. She appears well-developed and well-nourished. No distress.  HENT:  Head: Normocephalic and atraumatic.    Eyes: Conjunctivae and EOM are normal.  Neck: Neck supple. No spinous process tenderness and no muscular tenderness present. Normal range of motion present.  Cardiovascular: Normal rate and regular rhythm.   Pulmonary/Chest: Effort normal and  breath sounds normal. No stridor. No respiratory distress.  Abdominal: She exhibits no distension.  Musculoskeletal: She exhibits no edema.  Neurological: She is alert and oriented to person, place, and time. No cranial nerve deficit.  Poor hearing otherwise unremarkable  Skin: Skin is warm and dry.  Psychiatric: She has a normal mood and affect.  Nursing note and vitals reviewed.    ED Treatments / Results   Radiology Ct Head Wo Contrast  Result Date: 09/15/2016 CLINICAL DATA:  Pt fell Thursday and hit rt side of head, Friday CT scan done, no results, fell again this AM hitting top forehead on night table. No LOC EXAM: CT HEAD WITHOUT CONTRAST TECHNIQUE: Contiguous axial images were obtained from the base of the skull through the vertex without intravenous contrast. COMPARISON:  None. FINDINGS: Brain: No evidence of acute infarction, hemorrhage, hydrocephalus, extra-axial collection or mass lesion/mass effect. 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 The ventricles and sulci are are enlarged reflecting age appropriate volume loss. Patchy white matter hypoattenuation is noted consistent with mild to moderate chronic microvascular ischemic change. Vascular: No hyperdense vessel or unexpected calcification. Skull: Normal. Negative for fracture or focal lesion. Sinuses/Orbits: Small radiopacity projects along the anterior chamber of the left globe. This has the appearance of metal. It is presumably chronic. Globes and orbits are otherwise unremarkable. Visualized sinuses and mastoid air cells are clear. Other: None. IMPRESSION: 1. No acute intracranial abnormalities. 2. Age-appropriate volume loss. Mild to moderate chronic microvascular ischemic change. 3. Small metallic foreign body projects along the anterior chamber of the left globe. This would be a contraindication to MRI. Electronically Signed   By: Amie Portlandavid  Ormond M.D.   On: 09/15/2016 10:16     Procedures Procedures (including critical care time)  Medications Ordered in ED Medications - No data to display   Initial Impression / Assessment and Plan / ED Course  I have reviewed the triage vital signs and the nursing notes.  Pertinent labs & imaging results that were available during my care of the patient were reviewed by me and considered in my medical decision making (see chart for details).  Elderly female presents after mechanical fall.  She is awake, alert, neurologically intact, hemodynamically stable. With reassuring physical exam, CT scanning, absence of distress, patient was discharged with return precautions.  Final Clinical Impressions(s) / ED Diagnoses   Final diagnoses:  Fall, initial encounter      Gerhard MunchLockwood, Danasha Melman, MD 09/15/16 1037

## 2016-09-15 NOTE — ED Notes (Signed)
Bed: WA23 Expected date:  Expected time:  Means of arrival:  Comments: 

## 2016-09-15 NOTE — ED Notes (Signed)
ED Provider at bedside. 

## 2016-12-09 ENCOUNTER — Encounter: Payer: Self-pay | Admitting: Nurse Practitioner

## 2016-12-09 ENCOUNTER — Ambulatory Visit (INDEPENDENT_AMBULATORY_CARE_PROVIDER_SITE_OTHER): Payer: Medicare Other | Admitting: Nurse Practitioner

## 2016-12-09 VITALS — BP 124/68 | HR 70 | Temp 98.3°F | Resp 18 | Ht 60.0 in | Wt 156.6 lb

## 2016-12-09 DIAGNOSIS — H409 Unspecified glaucoma: Secondary | ICD-10-CM

## 2016-12-09 DIAGNOSIS — N183 Chronic kidney disease, stage 3 unspecified: Secondary | ICD-10-CM

## 2016-12-09 DIAGNOSIS — M1990 Unspecified osteoarthritis, unspecified site: Secondary | ICD-10-CM

## 2016-12-09 DIAGNOSIS — F0280 Dementia in other diseases classified elsewhere without behavioral disturbance: Secondary | ICD-10-CM | POA: Diagnosis not present

## 2016-12-09 DIAGNOSIS — G301 Alzheimer's disease with late onset: Secondary | ICD-10-CM

## 2016-12-09 DIAGNOSIS — D631 Anemia in chronic kidney disease: Secondary | ICD-10-CM

## 2016-12-09 DIAGNOSIS — E2839 Other primary ovarian failure: Secondary | ICD-10-CM | POA: Diagnosis not present

## 2016-12-09 LAB — CBC WITH DIFFERENTIAL/PLATELET
BASOS PCT: 0 %
Basophils Absolute: 0 cells/uL (ref 0–200)
EOS ABS: 320 {cells}/uL (ref 15–500)
EOS PCT: 4 %
HEMATOCRIT: 33.4 % — AB (ref 35.0–45.0)
Hemoglobin: 10.9 g/dL — ABNORMAL LOW (ref 11.7–15.5)
Lymphocytes Relative: 29 %
Lymphs Abs: 2320 cells/uL (ref 850–3900)
MCH: 32.1 pg (ref 27.0–33.0)
MCHC: 32.6 g/dL (ref 32.0–36.0)
MCV: 98.2 fL (ref 80.0–100.0)
MONO ABS: 720 {cells}/uL (ref 200–950)
MONOS PCT: 9 %
MPV: 9.2 fL (ref 7.5–12.5)
Neutro Abs: 4640 cells/uL (ref 1500–7800)
Neutrophils Relative %: 58 %
PLATELETS: 206 10*3/uL (ref 140–400)
RBC: 3.4 MIL/uL — AB (ref 3.80–5.10)
RDW: 14.7 % (ref 11.0–15.0)
WBC: 8 10*3/uL (ref 3.8–10.8)

## 2016-12-09 NOTE — Progress Notes (Signed)
Careteam: Patient Care Team: Lauree Chandler, NP as PCP - General (Nurse Practitioner)  Advanced Directive information Does Patient Have a Medical Advance Directive?: Yes, Type of Advance Directive: [place];Living will  Allergies  Allergen Reactions  . Penicillins Hives and Itching    Chief Complaint  Patient presents with  . Medical Management of Chronic Issues    Pt is being seen for a 6 month follow up. Pt reports no concerns at this time.   . Other    Son in room. Son would like to have a FL2 completed to look into facility options.      HPI: Patient is a 83 y.o. female seen in the office today for routine follow up.  Son notes total confusion, dementia is mush worse.  She tried to be on medication for memory but became more confused and combative therefore they were titrated off.  Recently wandering- went outside and was looking in her son truck. He has his sister coming into town to stay with her but they will need placement at some point.  No increase in pain.  Eating 2 meals a day.   Review of Systems:  Review of Systems  Unable to perform ROS: Dementia    Past Medical History:  Diagnosis Date  . Allergic rhinitis 07/05/2013  . Alzheimer's disease 07/05/2013  . Arthritis   . Benign paroxysmal positional vertigo 07/05/2013  . Generalized osteoarthritis of multiple sites 07/05/2013  . Glaucoma   . Hyperglycemia 09/22/2012  . Memory loss 03/31/2013  . Unspecified constipation 03/31/2013   Past Surgical History:  Procedure Laterality Date  . AMPUTATION FINGER / THUMB Right 1963   index  . EYE SURGERY Bilateral    catracts  . TONSILECTOMY/ADENOIDECTOMY WITH MYRINGOTOMY     as child   Social History:   reports that she has never smoked. She has never used smokeless tobacco. She reports that she does not drink alcohol or use drugs.  Family History  Problem Relation Age of Onset  . Cancer Mother   . Heart disease Father   . Heart  disease Brother   . Heart disease Brother   . Heart disease Brother     Medications: Patient's Medications  New Prescriptions   No medications on file  Previous Medications   ASPIRIN 81 MG CHEWABLE TABLET    Chew 81 mg by mouth daily.   CALCIUM GLUCONATE 500 MG TABLET    Take 1 tablet by mouth daily.   LATANOPROST (XALATAN) 0.005 % OPHTHALMIC SOLUTION    1 drop at bedtime.   OMEGA-3 FATTY ACIDS (FISH OIL PO)    Take by mouth.   TIMOLOL (TIMOPTIC) 0.5 % OPHTHALMIC SOLUTION    Apply to eye.  Modified Medications   No medications on file  Discontinued Medications   No medications on file     Physical Exam:  Vitals:   12/09/16 1445  BP: 124/68  Pulse: 70  Resp: 18  Temp: 98.3 F (36.8 C)  TempSrc: Oral  SpO2: 97%  Weight: 156 lb 9.6 oz (71 kg)  Height: 5' (1.524 m)   Body mass index is 30.58 kg/m.  Physical Exam  Constitutional: She appears well-developed and well-nourished.  HENT:  Mouth/Throat: Oropharynx is clear and moist. No oropharyngeal exudate.  halitosis  Eyes: Conjunctivae are normal.  Neck: Normal range of motion. Neck supple.  Cardiovascular: Normal rate, regular rhythm and normal heart sounds.   Pulmonary/Chest: Effort normal and breath sounds normal. No  respiratory distress.  Abdominal: Soft. Bowel sounds are normal. She exhibits no distension.  Musculoskeletal: She exhibits tenderness (to bilateral knees). She exhibits no edema.  Neurological: She is alert.  Oriented to person only  Skin: Skin is warm and dry.  Psychiatric: She has a normal mood and affect.    Labs reviewed: Basic Metabolic Panel:  Recent Labs  03/05/16 1615 06/11/16 1518  NA 141 140  K 4.5 5.1  CL 107 105  CO2 25 26  GLUCOSE 89 104*  BUN 19 20  CREATININE 1.27* 1.38*  CALCIUM 9.2 9.2   Liver Function Tests:  Recent Labs  03/05/16 1615  AST 20  ALT 11  ALKPHOS 54  BILITOT 0.3  PROT 6.5  ALBUMIN 3.6   No results for input(s): LIPASE, AMYLASE in the last  8760 hours. No results for input(s): AMMONIA in the last 8760 hours. CBC:  Recent Labs  03/05/16 1615 06/11/16 1518  WBC 7.2 8.3  NEUTROABS 3,816 4,731  HGB 10.8* 11.2*  HCT 32.5* 34.4*  MCV 98.2 97.2  PLT 178 197   Lipid Panel: No results for input(s): CHOL, HDL, LDLCALC, TRIG, CHOLHDL, LDLDIRECT in the last 8760 hours. TSH: No results for input(s): TSH in the last 8760 hours. A1C: Lab Results  Component Value Date   HGBA1C 6.0 08/10/2012   MMSE - Mini Mental State Exam 05/09/2016 09/22/2012 09/22/2012  Not completed: Unable to complete - -  Orientation to time - 4 4  Orientation to Place - 5 5  Registration - 3 3  Attention/ Calculation - 3 3  Recall - 2 2  Language- name 2 objects - 2 2  Language- repeat - 1 1  Language- follow 3 step command - 3 3  Language- read & follow direction - 1 1  Write a sentence - 1 1  Copy design - 0 -  Total score - 25 -    Assessment/Plan 1. Late onset Alzheimer's disease without behavioral disturbance -ongoing memory loss, did not tolerate medications.  -FL2 completed.  -supportive care provided by family  2. Arthritis Stable at this time.   3. CKD (chronic kidney disease) stage 3, GFR 30-59 ml/min -Encourage proper hydration and to avoid NSAIDS (Aleve, Advil, Motrin, Ibuprofen)  - CMP with eGFR  4. Glaucoma of both eyes, unspecified glaucoma type Following with ophthalmologist.   5. Anemia in stage 3 chronic kidney disease - CBC with Differential/Platelets to follow up   6. Estrogen deficiency - DG Bone Density; Future for OP screening  Next appt: 6 months sooner if needed  Jessica K. Harle Battiest  [facility] [phone] 8 am - 5 pm) [phone] (after hours)

## 2016-12-10 LAB — COMPLETE METABOLIC PANEL WITH GFR
ALT: 9 U/L (ref 6–29)
AST: 17 U/L (ref 10–35)
Albumin: 3.6 g/dL (ref 3.6–5.1)
Alkaline Phosphatase: 66 U/L (ref 33–130)
BUN: 20 mg/dL (ref 7–25)
CALCIUM: 9.3 mg/dL (ref 8.6–10.4)
CHLORIDE: 106 mmol/L (ref 98–110)
CO2: 22 mmol/L (ref 20–32)
CREATININE: 1.37 mg/dL — AB (ref 0.60–0.88)
GFR, EST AFRICAN AMERICAN: 37 mL/min — AB (ref >=60)
GFR, EST NON AFRICAN AMERICAN: 32 mL/min — AB (ref >=60)
Glucose, Bld: 96 mg/dL (ref 65–99)
POTASSIUM: 4.9 mmol/L (ref 3.5–5.3)
Sodium: 140 mmol/L (ref 135–146)
Total Bilirubin: 0.3 mg/dL (ref 0.2–1.2)
Total Protein: 6.6 g/dL (ref 6.1–8.1)

## 2016-12-26 ENCOUNTER — Ambulatory Visit
Admission: RE | Admit: 2016-12-26 | Discharge: 2016-12-26 | Disposition: A | Discharge status: Home / Self Care | Payer: Medicare Other | Source: Ambulatory Visit | Attending: Nurse Practitioner | Admitting: Nurse Practitioner

## 2016-12-26 ENCOUNTER — Telehealth: Payer: Self-pay

## 2016-12-26 DIAGNOSIS — E2839 Other primary ovarian failure: Secondary | ICD-10-CM

## 2016-12-26 NOTE — Telephone Encounter (Signed)
Medication list updated to reflect recommendation from bone density scan

## 2017-05-12 ENCOUNTER — Encounter: Payer: Self-pay | Admitting: Nurse Practitioner

## 2017-05-12 ENCOUNTER — Ambulatory Visit (INDEPENDENT_AMBULATORY_CARE_PROVIDER_SITE_OTHER): Payer: Medicare Other | Admitting: Nurse Practitioner

## 2017-05-12 VITALS — BP 142/78 | HR 72 | Temp 98.5°F | Ht 60.0 in | Wt 157.0 lb

## 2017-05-12 DIAGNOSIS — F0280 Dementia in other diseases classified elsewhere without behavioral disturbance: Secondary | ICD-10-CM | POA: Diagnosis not present

## 2017-05-12 DIAGNOSIS — N183 Chronic kidney disease, stage 3 unspecified: Secondary | ICD-10-CM

## 2017-05-12 DIAGNOSIS — M1990 Unspecified osteoarthritis, unspecified site: Secondary | ICD-10-CM

## 2017-05-12 DIAGNOSIS — H409 Unspecified glaucoma: Secondary | ICD-10-CM | POA: Diagnosis not present

## 2017-05-12 DIAGNOSIS — G301 Alzheimer's disease with late onset: Secondary | ICD-10-CM | POA: Diagnosis not present

## 2017-05-12 DIAGNOSIS — M8580 Other specified disorders of bone density and structure, unspecified site: Secondary | ICD-10-CM | POA: Diagnosis not present

## 2017-05-12 DIAGNOSIS — Z23 Encounter for immunization: Secondary | ICD-10-CM

## 2017-05-12 DIAGNOSIS — Z111 Encounter for screening for respiratory tuberculosis: Secondary | ICD-10-CM | POA: Diagnosis not present

## 2017-05-12 NOTE — Progress Notes (Signed)
Careteam: Patient Care Team: Sharon SellerEubanks, Jessica K, NP as PCP - General (Nurse Practitioner)  Advanced Directive information    Allergies  Allergen Reactions  . Aricept [Donepezil Hcl]     Agitation   . Namenda [Memantine]     Agitation   . Penicillins Hives and Itching    Chief Complaint  Patient presents with  . Acute Visit    Pt is being seen for completion of FL2 form  . Immunizations    Pt would like to have flu vaccine today.      HPI: Patient is a [age] y.o. female seen in the office today for FL2 to be completed. Pt with dementia. Unsure of where she is going but wants to be placed by Feb 18th.  Wynelle LinkSun downers has gotten worse.  She does not recognize her son any more.  Confusion is the biggest problem. Still dresses and bathes herself. Getting messy at meal times. Weight has been stable.  No pain.  Bowels and bladder normal.   Still following with [facility] in [place] for ophthalmology. glaucoma has been stable.   Has hearing aids but refusing to use them. Gets very agitated when son puts them in.   Osteopenia noted on dexa scan in 2018. On caltrate with D 600/400 twice a day.    Review of Systems:  Review of Systems  Unable to perform ROS: Dementia    Past Medical History:  Diagnosis Date  . Allergic rhinitis 07/05/2013  . Alzheimer's disease 07/05/2013  . Arthritis   . Benign paroxysmal positional vertigo 07/05/2013  . Generalized osteoarthritis of multiple sites 07/05/2013  . Glaucoma   . Hyperglycemia 09/22/2012  . Memory loss 03/31/2013  . Unspecified constipation 03/31/2013   Past Surgical History:  Procedure Laterality Date  . AMPUTATION FINGER / THUMB Right 1963   index  . EYE SURGERY Bilateral    catracts  . TONSILECTOMY/ADENOIDECTOMY WITH MYRINGOTOMY     as child   Social History:   reports that  has never smoked. she has never used smokeless tobacco. She reports that she does not drink alcohol or use drugs.  Family History    Problem Relation Age of Onset  . Cancer Mother   . Heart disease Father   . Heart disease Brother   . Heart disease Brother   . Heart disease Brother     Medications: Patient's Medications  New Prescriptions   No medications on file  Previous Medications   ASPIRIN 81 MG CHEWABLE TABLET    Chew 81 mg by mouth daily.   CALCIUM CARBONATE-VITAMIN D (CALTRATE 600+D) 600-400 MG-UNIT TABLET    Take 1 tablet by mouth 2 (two) times daily.   LATANOPROST (XALATAN) 0.005 % OPHTHALMIC SOLUTION    Place 1 drop into both eyes at bedtime.    OMEGA-3 FATTY ACIDS (FISH OIL PO)    Take 1 capsule by mouth daily.    TIMOLOL (TIMOPTIC) 0.5 % OPHTHALMIC SOLUTION    Place 1 drop into both eyes 2 (two) times daily.   Modified Medications   No medications on file  Discontinued Medications   CALCIUM GLUCONATE 500 MG TABLET    Take 1 tablet by mouth daily.     Physical Exam:  Vitals:   05/12/17 1051  BP: (!) 142/78  Pulse: 72  Temp: 98.5 F (36.9 C)  TempSrc: Oral  SpO2: 96%  Weight: 157 lb (71.2 kg)  Height: 5' (1.524 m)   Body mass index is  30.66 kg/m.  Physical Exam  Constitutional: She appears well-developed and well-nourished.  HENT:  Mouth/Throat: Oropharynx is clear and moist. No oropharyngeal exudate.  halitosis  Eyes: Conjunctivae are normal.  Neck: Normal range of motion. Neck supple.  Cardiovascular: Normal rate, regular rhythm and normal heart sounds.  Pulmonary/Chest: Effort normal and breath sounds normal. No respiratory distress.  Abdominal: Soft. Bowel sounds are normal. She exhibits no distension.  Musculoskeletal: She exhibits tenderness (to bilateral knees). She exhibits no edema.  Neurological: She is alert.  Oriented to person only  Skin: Skin is warm and dry.  Psychiatric: She has a normal mood and affect.    Labs reviewed: Basic Metabolic Panel: Recent Labs    06/11/16 1518 12/09/16 1534  NA 140 140  K 5.1 4.9  CL 105 106  CO2 26 22  GLUCOSE 104* 96   BUN 20 20  CREATININE 1.38* 1.37*  CALCIUM 9.2 9.3   Liver Function Tests: Recent Labs    12/09/16 1534  AST 17  ALT 9  ALKPHOS 66  BILITOT 0.3  PROT 6.6  ALBUMIN 3.6   No results for input(s): LIPASE, AMYLASE in the last 8760 hours. No results for input(s): AMMONIA in the last 8760 hours. CBC: Recent Labs    06/11/16 1518 12/09/16 1534  WBC 8.3 8.0  NEUTROABS 4,731 4,640  HGB 11.2* 10.9*  HCT 34.4* 33.4*  MCV 97.2 98.2  PLT 197 206   Lipid Panel: No results for input(s): CHOL, HDL, LDLCALC, TRIG, CHOLHDL, LDLDIRECT in the last 8760 hours. TSH: No results for input(s): TSH in the last 8760 hours. A1C: Lab Results  Component Value Date   HGBA1C 6.0 08/10/2012     Assessment/Plan 1. Late onset Alzheimer's disease without behavioral disturbance -progressively worsening as the disease progresses, unable to stay with son. FL2 completed for SNF.  -she was unable to tolerate aricept or namenda due to worsening of behaviors.  - DNR (Do Not Resuscitate)  2. Need for immunization against influenza - Flu vaccine HIGH DOSE PF (Fluzone High dose)  3. Arthritis -conts to have worsening OA to hands which makes eating harder, needing help from family and having a lot of spills. Weight has been stable.   4. CKD (chronic kidney disease) stage 3, GFR 30-59 ml/min (HCC) -Encourage proper hydration and to avoid NSAIDS (Aleve, Advil, Motrin, Ibuprofen)   5. Glaucoma of both eyes, unspecified glaucoma type Stable, conts to follow up with specialist in at [facility] eye care in [place]  6. Osteopenia, unspecified location Cont to take caltrate with D 600/400 twice a day.   7. Screening-pulmonary TB - PPD  Next appt: nurse visit for PPD, discussed follow up in 6 months at [facility] if she ends up in memory care AL unit.  Janene Harvey. Biagio Borg  [facility] [phone] 8 am - 5 pm) [phone] (after hours)

## 2017-05-12 NOTE — Patient Instructions (Addendum)
Follow up in 6 months if needed- may be going to skilled nursing and if so the provider at the facility will see you there   To follow up in 48-72 hours for nurse visit for PPD to be read

## 2017-05-14 ENCOUNTER — Ambulatory Visit: Payer: Medicare Other | Admitting: Nurse Practitioner

## 2017-05-14 VITALS — HR 69 | Temp 98.3°F | Resp 10

## 2017-05-14 DIAGNOSIS — Z111 Encounter for screening for respiratory tuberculosis: Secondary | ICD-10-CM

## 2017-05-14 LAB — TB SKIN TEST
INDURATION: 0 mm
TB Skin Test: NEGATIVE

## 2017-05-14 NOTE — Progress Notes (Signed)
Ppd read today by MA Chrae in office

## 2017-06-02 ENCOUNTER — Telehealth: Payer: Self-pay | Admitting: *Deleted

## 2017-06-02 NOTE — Telephone Encounter (Signed)
Kathy Mcclain with [facility] called and stated that she sent FL2 form back for corrections and she has not received it back. Stated she was calling for status due to deadline. Please Advise.

## 2017-06-02 NOTE — Telephone Encounter (Signed)
Received and placed in Jessica's folder to review and correct.

## 2017-06-02 NOTE — Telephone Encounter (Signed)
Shanda BumpsJessica returned [employer] called stating she tried to fax FL2 x 3 and it would not go through. Jessica's coworker Hallie will bring FL2 into office for corrections/completion

## 2017-06-02 NOTE — Telephone Encounter (Signed)
I have not received any additional information or updated FL2 form

## 2017-06-02 NOTE — Telephone Encounter (Signed)
Kathy Mcclain stated that she has not received a corrections FL2.   I called Kathy Mcclain at [facility] and asked her to refax what she needed corrected. Agreed will refax.

## 2017-06-11 ENCOUNTER — Ambulatory Visit: Payer: Medicare Other | Admitting: Nurse Practitioner

## 2017-06-20 ENCOUNTER — Emergency Department (HOSPITAL_COMMUNITY)
Admission: EM | Admit: 2017-06-20 | Discharge: 2017-06-20 | Disposition: A | Discharge status: Home / Self Care | Payer: Medicare Other | Attending: Emergency Medicine | Admitting: Emergency Medicine

## 2017-06-20 ENCOUNTER — Other Ambulatory Visit: Payer: Self-pay

## 2017-06-20 ENCOUNTER — Emergency Department (HOSPITAL_COMMUNITY): Payer: Medicare Other

## 2017-06-20 DIAGNOSIS — G309 Alzheimer's disease, unspecified: Secondary | ICD-10-CM | POA: Insufficient documentation

## 2017-06-20 DIAGNOSIS — Y999 Unspecified external cause status: Secondary | ICD-10-CM | POA: Diagnosis not present

## 2017-06-20 DIAGNOSIS — S0990XA Unspecified injury of head, initial encounter: Secondary | ICD-10-CM

## 2017-06-20 DIAGNOSIS — W1839XA Other fall on same level, initial encounter: Secondary | ICD-10-CM | POA: Diagnosis not present

## 2017-06-20 DIAGNOSIS — Z79899 Other long term (current) drug therapy: Secondary | ICD-10-CM | POA: Insufficient documentation

## 2017-06-20 DIAGNOSIS — S098XXA Other specified injuries of head, initial encounter: Secondary | ICD-10-CM | POA: Diagnosis present

## 2017-06-20 DIAGNOSIS — Y9389 Activity, other specified: Secondary | ICD-10-CM | POA: Insufficient documentation

## 2017-06-20 DIAGNOSIS — Z7982 Long term (current) use of aspirin: Secondary | ICD-10-CM | POA: Insufficient documentation

## 2017-06-20 DIAGNOSIS — S0003XA Contusion of scalp, initial encounter: Secondary | ICD-10-CM | POA: Insufficient documentation

## 2017-06-20 DIAGNOSIS — Y92128 Other place in nursing home as the place of occurrence of the external cause: Secondary | ICD-10-CM | POA: Insufficient documentation

## 2017-06-20 DIAGNOSIS — W19XXXA Unspecified fall, initial encounter: Secondary | ICD-10-CM

## 2017-06-20 NOTE — ED Notes (Signed)
Report given to [facility] Tina-Med tech

## 2017-06-20 NOTE — ED Notes (Signed)
Bed: [facility]B Expected date:  Expected time:  Means of arrival:  Comments: EMS fall 

## 2017-06-20 NOTE — ED Provider Notes (Signed)
[facility] Provider Note  CSN: [id number] Arrival date & time: 06/20/17 1621  Chief Complaint(s) Fall  Triage Note 1635 Per EMS, patient comes from [facility], is confused to her baseline. Went to lean back against a wall at the nurses station and missed the wall and fell. No LOC, no blood thinners, takes ASA qday. Golfball size hematoma on back on head. No pain. Son Genevie Cheshire on the way.    HPI Kathy Mcclain is a 84 y.o. female with a history of Alzheimer's disease here for a mechanical fall resulting in head trauma.  Remainder of history, ROS, and physical exam limited due to patient's condition (dementia). Additional information was obtained from EMS.   Level V Caveat.    HPI  Past Medical History Past Medical History:  Diagnosis Date  . Allergic rhinitis 07/05/2013  . Alzheimer's disease 07/05/2013  . Arthritis   . Benign paroxysmal positional vertigo 07/05/2013  . Generalized osteoarthritis of multiple sites 07/05/2013  . Glaucoma   . Hyperglycemia 09/22/2012  . Memory loss 03/31/2013  . Unspecified constipation 03/31/2013   Patient Active Problem List   Diagnosis Date Noted  . Pseudophakia of both eyes 09/07/2014  . Primary open-angle glaucoma 02/28/2014  . Alzheimer's disease 07/05/2013  . Chronic constipation 07/05/2013  . Generalized osteoarthritis of multiple sites 07/05/2013  . Allergic rhinitis 07/05/2013  . Memory loss 03/31/2013  . Other and unspecified hyperlipidemia 03/31/2013  . Unspecified constipation 03/31/2013  . Dry skin 03/31/2013  . Hyperglycemia 09/22/2012  . Arthritis 08/10/2012  . Amblyopia, left eye 03/23/2012  . Branch retinal vein occlusion of left eye 03/23/2012  . Glaucoma filtering bleb, left eye 03/23/2012  . Macular degeneration 03/23/2012   Home Medication(s) Prior to Admission medications   Medication Sig Start Date End Date Taking? Authorizing Provider  aspirin 81 MG chewable tablet Chew 81 mg  by mouth daily.    [provider]  Calcium Carbonate-Vitamin D (CALTRATE 600+D) 600-400 MG-UNIT tablet Take 1 tablet by mouth 2 (two) times daily.    [provider]  latanoprost (XALATAN) 0.005 % ophthalmic solution Place 1 drop into both eyes at bedtime.     [provider]  Omega-3 Fatty Acids (FISH OIL PO) Take 1 capsule by mouth daily.     [provider]  timolol (TIMOPTIC) 0.5 % ophthalmic solution Place 1 drop into both eyes 2 (two) times daily.  08/03/14   [provider]                                                                                                                                    Past Surgical History Past Surgical History:  Procedure Laterality Date  . AMPUTATION FINGER / THUMB Right 1963   index  . EYE SURGERY Bilateral    catracts  . TONSILECTOMY/ADENOIDECTOMY WITH MYRINGOTOMY     as child   Family History Family History  Problem Relation Age of Onset  . Cancer Mother   . Heart disease Father   . Heart disease Brother   . Heart disease Brother   . Heart disease Brother     Social History Social History   Tobacco Use  . Smoking status: Never Smoker  . Smokeless tobacco: Never Used  Substance Use Topics  . Alcohol use: No  . Drug use: No   Allergies Aricept [donepezil hcl]; Namenda [memantine]; and Penicillins  Review of Systems Review of Systems  Unable to perform ROS: Dementia    Physical Exam Vital Signs  I have reviewed the triage vital signs BP (!) 142/83 (BP Location: Left Arm)   Pulse 75   Temp 98.3 F (36.8 C) (Oral)   Resp (!) 22   Ht 5\' 4"  (1.626 m)   Wt 71.2 kg (157 lb)   SpO2 100%   BMI 26.95 kg/m   Physical Exam  Constitutional: She is oriented to person, place, and time. She appears well-developed and well-nourished. No distress.  HENT:  Head: Normocephalic and atraumatic.    Right Ear: External ear normal.  Left Ear: External ear normal.  Nose: Nose normal.    Eyes: Conjunctivae and EOM are normal. Pupils are equal, round, and reactive to light. Right eye exhibits no discharge. Left eye exhibits no discharge. No scleral icterus.  Neck: Normal range of motion. Neck supple.  Cardiovascular: Normal rate, regular rhythm and normal heart sounds. Exam reveals no gallop and no friction rub.  No murmur heard. Pulses:      Radial pulses are 2+ on the right side, and 2+ on the left side.       Dorsalis pedis pulses are 2+ on the right side, and 2+ on the left side.  Pulmonary/Chest: Effort normal and breath sounds normal. No stridor. No respiratory distress. She has no wheezes.  Abdominal: Soft. She exhibits no distension. There is no tenderness.  Musculoskeletal: She exhibits no edema or tenderness.       Cervical back: She exhibits no bony tenderness.       Thoracic back: She exhibits no bony tenderness.       Lumbar back: She exhibits no bony tenderness.  Clavicles stable. Chest stable to AP/Lat compression. Pelvis stable to Lat compression. No obvious extremity deformity. No chest or abdominal wall contusion.  Neurological: She is alert and oriented to person, place, and time.  Moving all extremities  Skin: Skin is warm and dry. No rash noted. She is not diaphoretic. No erythema.  Psychiatric: She has a normal mood and affect.    ED Results and Treatments Labs (all labs ordered are listed, but only abnormal results are displayed) Labs Reviewed - No data to display                                                                                                                       EKG  EKG Interpretation  Date/Time:    Ventricular Rate:  PR Interval:    QRS Duration:   QT Interval:    QTC Calculation:   R Axis:     Text Interpretation:        Radiology Ct Head Wo Contrast  Result Date: 06/20/2017 CLINICAL DATA:  Fall with hematoma along the back scalp. EXAM: CT HEAD WITHOUT CONTRAST CT CERVICAL SPINE WITHOUT CONTRAST TECHNIQUE:  Multidetector CT imaging of the head and cervical spine was performed following the standard protocol without intravenous contrast. Multiplanar CT image reconstructions of the cervical spine were also generated. COMPARISON:  CT head 09/15/2016 FINDINGS: CT HEAD FINDINGS Brain: There are 2 small remote lacunar infarcts in the right cerebellum on image 7/2, these appear stable. Dilated perivascular space or small remote lacunar infarct along the inferior aspect of the left lentiform nucleus. Periventricular white matter and corona radiata hypodensities favor chronic ischemic microvascular white matter disease. No intracranial hemorrhage, mass lesion, or acute CVA. Vascular: There is atherosclerotic calcification of the cavernous carotid arteries bilaterally. Skull: Unremarkable Sinuses/Orbits: Chronic small metal foreign body along the left anterior chamber the globe, possibly along the cornea. This has been present since the prior exam. Other: Left parietal scalp hematoma. CT CERVICAL SPINE FINDINGS Alignment: 1.5 mm degenerative anterolisthesis at C2-3. 2 mm degenerative anterolisthesis at C7-T1. Skull base and vertebrae: Chronic appearing facet arthropathy on the right at this level. Notable spurring anteriorly at the C1-2 articulation. No appreciable cervical spine fracture. Soft tissues and spinal canal: Soft tissue density posterior to the odontoid up to 0.7 cm, probably pannus. No significant prevertebral soft tissue swelling. Disc levels: Prominent loss of disc height at all levels between C3 and C7 with associated uncinate and facet spurring causing bilateral osseous foraminal stenosis at C3-4, C4-5, and C5-6; and right foraminal stenosis at C6-7. Upper chest: Atherosclerosis of the aortic arch and branch vessels. Other: No supplemental non-categorized findings. IMPRESSION: 1. No acute intracranial findings or acute cervical spine findings. There is a scalp hematoma along the left parietal scalp. 2. To remote  lacunar infarcts in the right cerebellum. Chronic ischemic microvascular white matter disease in the brain. 3. Chronic small metal foreign body in the left anterior chamber the globe; this could contraindicate MRI. 4. Degenerative disc disease and spondylosis in the cervical spine causing impingement at all levels between C3 and C7. 5.  Aortic Atherosclerosis (ICD10-I70.0). 6. Soft tissue density posterior to the odontoid is likely attributable to pannus. Electronically Signed   By: Gaylyn Rong M.D.   On: 06/20/2017 17:55   Ct Cervical Spine Wo Contrast  Result Date: 06/20/2017 CLINICAL DATA:  Fall with hematoma along the back scalp. EXAM: CT HEAD WITHOUT CONTRAST CT CERVICAL SPINE WITHOUT CONTRAST TECHNIQUE: Multidetector CT imaging of the head and cervical spine was performed following the standard protocol without intravenous contrast. Multiplanar CT image reconstructions of the cervical spine were also generated. COMPARISON:  CT head 09/15/2016 FINDINGS: CT HEAD FINDINGS Brain: There are 2 small remote lacunar infarcts in the right cerebellum on image 7/2, these appear stable. Dilated perivascular space or small remote lacunar infarct along the inferior aspect of the left lentiform nucleus. Periventricular white matter and corona radiata hypodensities favor chronic ischemic microvascular white matter disease. No intracranial hemorrhage, mass lesion, or acute CVA. Vascular: There is atherosclerotic calcification of the cavernous carotid arteries bilaterally. Skull: Unremarkable Sinuses/Orbits: Chronic small metal foreign body along the left anterior chamber the globe, possibly along the cornea. This has been present since the prior exam. Other: Left parietal scalp hematoma. CT CERVICAL  SPINE FINDINGS Alignment: 1.5 mm degenerative anterolisthesis at C2-3. 2 mm degenerative anterolisthesis at C7-T1. Skull base and vertebrae: Chronic appearing facet arthropathy on the right at this level. Notable  spurring anteriorly at the C1-2 articulation. No appreciable cervical spine fracture. Soft tissues and spinal canal: Soft tissue density posterior to the odontoid up to 0.7 cm, probably pannus. No significant prevertebral soft tissue swelling. Disc levels: Prominent loss of disc height at all levels between C3 and C7 with associated uncinate and facet spurring causing bilateral osseous foraminal stenosis at C3-4, C4-5, and C5-6; and right foraminal stenosis at C6-7. Upper chest: Atherosclerosis of the aortic arch and branch vessels. Other: No supplemental non-categorized findings. IMPRESSION: 1. No acute intracranial findings or acute cervical spine findings. There is a scalp hematoma along the left parietal scalp. 2. To remote lacunar infarcts in the right cerebellum. Chronic ischemic microvascular white matter disease in the brain. 3. Chronic small metal foreign body in the left anterior chamber the globe; this could contraindicate MRI. 4. Degenerative disc disease and spondylosis in the cervical spine causing impingement at all levels between C3 and C7. 5.  Aortic Atherosclerosis (ICD10-I70.0). 6. Soft tissue density posterior to the odontoid is likely attributable to pannus. Electronically Signed   By: Gaylyn RongWalter  Liebkemann M.D.   On: 06/20/2017 17:55   Pertinent labs & imaging results that were available during my care of the patient were reviewed by me and considered in my medical decision making (see chart for details).  Medications Ordered in ED Medications - No data to display                                                                                                                                  Procedures Procedures  (including critical care time)  Medical Decision Making / ED Course I have reviewed the nursing notes for this encounter and the patient's prior records (if available in EHR or on provided paperwork).    Mechanical fall resulting in head trauma.  CT head and cervical spine  without acute process or injuries.   The patient is safe for discharge with strict return precautions.   Final Clinical Impression(s) / ED Diagnoses Final diagnoses:  Fall, initial encounter  Injury of head, initial encounter  Contusion of scalp, initial encounter   Disposition: Discharge to snf  Condition: Good    ED Discharge Orders    None       Follow Up: Kathy SellerEubanks, Jessica K, NP [address] [place] [address] [phone]  Schedule an appointment as soon as possible for a visit  As needed      This chart was dictated using voice recognition software.  Despite best efforts to proofread,  errors can occur which can change the documentation meaning.   Nira Connardama, Pedro Eduardo, MD 06/20/17 [phone]

## 2017-06-20 NOTE — ED Notes (Signed)
[facility] called for transport- all papers printed and ready

## 2017-06-20 NOTE — ED Notes (Signed)
Patient transported to CT 

## 2017-06-20 NOTE — ED Triage Notes (Signed)
Per EMS, patient comes from [facility], is confused to her baseline. Went to lean back against a wall at the nurses station and missed the wall and fell. No LOC, no blood thinners, takes ASA qday. Golfball size hematoma on back on head. No pain. Son Genevie CheshireBilly on the way.

## 2017-10-13 ENCOUNTER — Telehealth: Payer: Self-pay | Admitting: Nurse Practitioner

## 2017-10-13 NOTE — Telephone Encounter (Signed)
Left msg w/ pt's son asking him to call me at [phone].  I wanted to follow up to see if his mom is still being seen at facility. VDM (DD)

## 2017-10-14 NOTE — Telephone Encounter (Signed)
Kathy HarmanWilliam Mcclain called me back and said that his mom is a long-term resident [facility].  They are handling her care. VDM (DD)

## 2017-12-12 ENCOUNTER — Other Ambulatory Visit: Payer: Self-pay

## 2017-12-12 ENCOUNTER — Emergency Department (HOSPITAL_COMMUNITY)
Admission: EM | Admit: 2017-12-12 | Discharge: 2017-12-12 | Disposition: A | Discharge status: Home / Self Care | Payer: Medicare Other | Attending: Emergency Medicine | Admitting: Emergency Medicine

## 2017-12-12 ENCOUNTER — Emergency Department (HOSPITAL_COMMUNITY): Payer: Medicare Other

## 2017-12-12 ENCOUNTER — Encounter (HOSPITAL_COMMUNITY): Payer: Self-pay

## 2017-12-12 DIAGNOSIS — F0280 Dementia in other diseases classified elsewhere without behavioral disturbance: Secondary | ICD-10-CM | POA: Diagnosis not present

## 2017-12-12 DIAGNOSIS — R4182 Altered mental status, unspecified: Secondary | ICD-10-CM | POA: Diagnosis not present

## 2017-12-12 DIAGNOSIS — E860 Dehydration: Secondary | ICD-10-CM | POA: Diagnosis not present

## 2017-12-12 DIAGNOSIS — G309 Alzheimer's disease, unspecified: Secondary | ICD-10-CM | POA: Insufficient documentation

## 2017-12-12 DIAGNOSIS — Z7982 Long term (current) use of aspirin: Secondary | ICD-10-CM | POA: Diagnosis not present

## 2017-12-12 DIAGNOSIS — Z89021 Acquired absence of right finger(s): Secondary | ICD-10-CM | POA: Diagnosis not present

## 2017-12-12 DIAGNOSIS — R531 Weakness: Secondary | ICD-10-CM

## 2017-12-12 DIAGNOSIS — Z79899 Other long term (current) drug therapy: Secondary | ICD-10-CM | POA: Diagnosis not present

## 2017-12-12 LAB — COMPREHENSIVE METABOLIC PANEL
ALT: 21 U/L (ref 0–44)
AST: 28 U/L (ref 15–41)
Albumin: 3.4 g/dL — ABNORMAL LOW (ref 3.5–5.0)
Alkaline Phosphatase: 56 U/L (ref 38–126)
Anion gap: 8 (ref 5–15)
BILIRUBIN TOTAL: 0.6 mg/dL (ref 0.3–1.2)
BUN: 35 mg/dL — AB (ref 8–23)
CO2: 23 mmol/L (ref 22–32)
Calcium: 9.9 mg/dL (ref 8.9–10.3)
Chloride: 107 mmol/L (ref 98–111)
Creatinine, Ser: 1.5 mg/dL — ABNORMAL HIGH (ref 0.44–1.00)
GFR, EST AFRICAN AMERICAN: 31 mL/min — AB (ref >60)
GFR, EST NON AFRICAN AMERICAN: 27 mL/min — AB (ref >60)
Glucose, Bld: 95 mg/dL (ref 70–99)
POTASSIUM: 4.8 mmol/L (ref 3.5–5.1)
Sodium: 138 mmol/L (ref 135–145)
TOTAL PROTEIN: 6.9 g/dL (ref 6.5–8.1)

## 2017-12-12 LAB — CBC WITH DIFFERENTIAL/PLATELET
Abs Immature Granulocytes: 0 10*3/uL (ref 0.0–0.1)
Basophils Absolute: 0 10*3/uL (ref 0.0–0.1)
Basophils Relative: 0 %
EOS PCT: 5 %
Eosinophils Absolute: 0.4 10*3/uL (ref 0.0–0.7)
HCT: 33 % — ABNORMAL LOW (ref 36.0–46.0)
HEMOGLOBIN: 10.6 g/dL — AB (ref 12.0–15.0)
Immature Granulocytes: 0 %
LYMPHS PCT: 19 %
Lymphs Abs: 1.4 10*3/uL (ref 0.7–4.0)
MCH: 31.6 pg (ref 26.0–34.0)
MCHC: 32.1 g/dL (ref 30.0–36.0)
MCV: 98.5 fL (ref 78.0–100.0)
Monocytes Absolute: 0.8 10*3/uL (ref 0.1–1.0)
Monocytes Relative: 11 %
Neutro Abs: 4.9 10*3/uL (ref 1.7–7.7)
Neutrophils Relative %: 65 %
Platelets: 158 10*3/uL (ref 150–400)
RBC: 3.35 MIL/uL — AB (ref 3.87–5.11)
RDW: 13.5 % (ref 11.5–15.5)
WBC: 7.6 10*3/uL (ref 4.0–10.5)

## 2017-12-12 LAB — URINALYSIS, ROUTINE W REFLEX MICROSCOPIC
Bilirubin Urine: NEGATIVE
GLUCOSE, UA: NEGATIVE mg/dL
HGB URINE DIPSTICK: NEGATIVE
KETONES UR: NEGATIVE mg/dL
NITRITE: NEGATIVE
PH: 6 (ref 5.0–8.0)
Protein, ur: NEGATIVE mg/dL
Specific Gravity, Urine: 1.015 (ref 1.005–1.030)

## 2017-12-12 LAB — I-STAT CHEM 8, ED
BUN: 40 mg/dL — AB (ref 8–23)
Calcium, Ion: 1.32 mmol/L (ref 1.15–1.40)
Chloride: 106 mmol/L (ref 98–111)
Creatinine, Ser: 1.5 mg/dL — ABNORMAL HIGH (ref 0.44–1.00)
Glucose, Bld: 92 mg/dL (ref 70–99)
HEMATOCRIT: 34 % — AB (ref 36.0–46.0)
Hemoglobin: 11.6 g/dL — ABNORMAL LOW (ref 12.0–15.0)
Potassium: 4.9 mmol/L (ref 3.5–5.1)
SODIUM: 139 mmol/L (ref 135–145)
TCO2: 24 mmol/L (ref 22–32)

## 2017-12-12 LAB — I-STAT CG4 LACTIC ACID, ED
LACTIC ACID, VENOUS: 1.52 mmol/L (ref 0.5–1.9)
Lactic Acid, Venous: 0.95 mmol/L (ref 0.5–1.9)

## 2017-12-12 LAB — TROPONIN I

## 2017-12-12 LAB — BRAIN NATRIURETIC PEPTIDE: B NATRIURETIC PEPTIDE 5: 214.8 pg/mL — AB (ref 0.0–100.0)

## 2017-12-12 MED ORDER — SODIUM CHLORIDE 0.9 % IV BOLUS
250.0000 mL | Freq: Once | INTRAVENOUS | Status: AC
  Administered 2017-12-12: 250 mL via INTRAVENOUS

## 2017-12-12 MED ORDER — SODIUM CHLORIDE 0.9 % IV SOLN
INTRAVENOUS | Status: DC
  Administered 2017-12-12: 13:00:00 via INTRAVENOUS

## 2017-12-12 NOTE — ED Notes (Signed)
Pt up for d/c  But family reports that no one told them and they are unhappy  They want to speak with the doctor

## 2017-12-12 NOTE — Discharge Instructions (Addendum)
Patient with extensive work-up here for her altered mental status clinically there was evidence of dehydration labs suggest that there is some to.  Patient received some IV fluids.  Patient is now awake and back to baseline.  Work-up otherwise without any significant findings.

## 2017-12-12 NOTE — Care Management Note (Signed)
Patient is being discharge returning to [facility]. Patient will be transported via [facility].

## 2017-12-12 NOTE — ED Notes (Signed)
Pt from [facility]  

## 2017-12-12 NOTE — ED Triage Notes (Addendum)
[facility]IB[facility] for c/o generalized weakness and lethargic sand not opening eyes  since yesterday ; hx of dementia ; per staff , pt is usually up and walking and conversating ;  Upon ems arrival , pts bp was 98/64, 250 cc bolus given ; CBG 87 ; pt able to follow simple commands a this time

## 2017-12-12 NOTE — ED Notes (Signed)
[facility] calls to [facility] to give report  No one answered on the floor the pt was coming back to  Will call family and tell them

## 2017-12-12 NOTE — ED Notes (Signed)
Report given to [facility] 

## 2017-12-12 NOTE — ED Notes (Signed)
[facility] called to [facility]  [facility]

## 2017-12-12 NOTE — ED Provider Notes (Signed)
[facility] EMERGENCY DEPARTMENT Provider Note   CSN: [id number] Arrival date & time: 12/12/17  8295     History   Chief Complaint Chief Complaint  Patient presents with  . Weakness    HPI Kathy Mcclain is a 84 y.o. female.  Patient brought in by EMS from [facility] dementia facility.  For generalized weakness and decreased mental status.  Patient not opening eyes since yesterday.  Does have a history of dementia but normally she is awake and conversing.  Blood pressure by EMS was 98/64.  She received 250 cc bolus.  Blood sugar was 87 patient according to EMS was able to follow some simple commands.  Upon arrival here patient would occasionally open her eyes but would not follow commands.  Patient was nonverbal.  Overall impression was patient had a depressed mental status.     Past Medical History:  Diagnosis Date  . Allergic rhinitis 07/05/2013  . Alzheimer's disease 07/05/2013  . Arthritis   . Benign paroxysmal positional vertigo 07/05/2013  . Generalized osteoarthritis of multiple sites 07/05/2013  . Glaucoma   . Hyperglycemia 09/22/2012  . Memory loss 03/31/2013  . Unspecified constipation 03/31/2013    Patient Active Problem List   Diagnosis Date Noted  . Pseudophakia of both eyes 09/07/2014  . Primary open-angle glaucoma 02/28/2014  . Alzheimer's disease 07/05/2013  . Chronic constipation 07/05/2013  . Generalized osteoarthritis of multiple sites 07/05/2013  . Allergic rhinitis 07/05/2013  . Memory loss 03/31/2013  . Other and unspecified hyperlipidemia 03/31/2013  . Unspecified constipation 03/31/2013  . Dry skin 03/31/2013  . Hyperglycemia 09/22/2012  . Arthritis 08/10/2012  . Amblyopia, left eye 03/23/2012  . Branch retinal vein occlusion of left eye 03/23/2012  . Glaucoma filtering bleb, left eye 03/23/2012  . Macular degeneration 03/23/2012    Past Surgical History:  Procedure Laterality Date  . AMPUTATION FINGER / THUMB Right  1963   index  . EYE SURGERY Bilateral    catracts  . TONSILECTOMY/ADENOIDECTOMY WITH MYRINGOTOMY     as child     OB History   None      Home Medications    Prior to Admission medications   Medication Sig Start Date End Date Taking? Authorizing Provider  aspirin 81 MG chewable tablet Chew 81 mg by mouth daily.   Yes [provider]  Calcium Carbonate-Vitamin D (CALTRATE 600+D) 600-400 MG-UNIT tablet Take 1 tablet by mouth 2 (two) times daily.   Yes [provider]  latanoprost (XALATAN) 0.005 % ophthalmic solution Place 1 drop into both eyes at bedtime.    Yes [provider]  LORazepam (ATIVAN) 0.5 MG tablet Take 0.5 mg by mouth 3 (three) times daily.   Yes [provider]  Omega-3 Fatty Acids (FISH OIL PO) Take 1 capsule by mouth daily.    Yes [provider]  timolol (TIMOPTIC) 0.5 % ophthalmic solution Place 1 drop into both eyes 2 (two) times daily.  08/03/14  Yes [provider]  traZODone (DESYREL) 100 MG tablet Take 100 mg by mouth See admin instructions. Daily at 15:00   Yes [provider]  traZODone (DESYREL) 150 MG tablet Take 75 mg by mouth at bedtime.   Yes [provider]    Family History Family History  Problem Relation Age of Onset  . Cancer Mother   . Heart disease Father   . Heart disease Brother   . Heart disease Brother   . Heart disease Brother  Social History Social History   Tobacco Use  . Smoking status: Never Smoker  . Smokeless tobacco: Never Used  Substance Use Topics  . Alcohol use: No  . Drug use: No     Allergies   Aricept [donepezil hcl]; Namenda [memantine]; and Penicillins   Review of Systems Review of Systems  Unable to perform ROS: Mental status change     Physical Exam Updated Vital Signs BP (!) 104/57   Pulse 82   Temp 97.8 F (36.6 C) (Oral)   Resp 17   Ht 1.651 m (5\' 5" )   Wt 74.8 kg   SpO2 96%   BMI 27.46 kg/m   Physical Exam    Constitutional: She appears well-developed and well-nourished. No distress.  HENT:  Head: Normocephalic and atraumatic.  Mouth/Throat: Oropharynx is clear and moist.  Eyes: Conjunctivae are normal.  Neck: Neck supple.  Cardiovascular: Normal rate and regular rhythm.  Pulmonary/Chest: Effort normal and breath sounds normal. No respiratory distress. She has no wheezes. She has no rales.  Abdominal: Soft. Bowel sounds are normal. She exhibits no distension. There is no tenderness.  Musculoskeletal: Normal range of motion. She exhibits no edema.  Neurological:  Somnolent and nonverbal.  Skin: Skin is warm. Capillary refill takes less than 2 seconds.  Nursing note and vitals reviewed.    ED Treatments / Results  Labs (all labs ordered are listed, but only abnormal results are displayed) Labs Reviewed  BRAIN NATRIURETIC PEPTIDE - Abnormal; Notable for the following components:      Result Value   B Natriuretic Peptide 214.8 (*)    All other components within normal limits  URINALYSIS, ROUTINE W REFLEX MICROSCOPIC - Abnormal; Notable for the following components:   APPearance HAZY (*)    Leukocytes, UA TRACE (*)    Bacteria, UA RARE (*)    All other components within normal limits  COMPREHENSIVE METABOLIC PANEL - Abnormal; Notable for the following components:   BUN 35 (*)    Creatinine, Ser 1.50 (*)    Albumin 3.4 (*)    GFR calc non Af Amer 27 (*)    GFR calc Af Amer 31 (*)    All other components within normal limits  CBC WITH DIFFERENTIAL/PLATELET - Abnormal; Notable for the following components:   RBC 3.35 (*)    Hemoglobin 10.6 (*)    HCT 33.0 (*)    All other components within normal limits  I-STAT CHEM 8, ED - Abnormal; Notable for the following components:   BUN 40 (*)    Creatinine, Ser 1.50 (*)    Hemoglobin 11.6 (*)    HCT 34.0 (*)    All other components within normal limits  CULTURE, BLOOD (ROUTINE X 2)  CULTURE, BLOOD (ROUTINE X 2)  URINE CULTURE  TROPONIN  I  I-STAT CG4 LACTIC ACID, ED  I-STAT CG4 LACTIC ACID, ED    EKG EKG Interpretation  Date/Time:  Friday December 12 2017 09:24:57 EDT Ventricular Rate:  67 PR Interval:    QRS Duration: 108 QT Interval:  403 QTC Calculation: 426 R Axis:   32 Text Interpretation:  Sinus rhythm Incomplete left bundle branch block Low voltage, precordial leads Interpretation limited secondary to artifact No previous ECGs available Confirmed by Vanetta MuldersZackowski, Milad Bublitz [phone]([id number]) on 12/12/2017 9:40:29 AM   Radiology Ct Head Wo Contrast  Result Date: 12/12/2017 CLINICAL DATA:  Generalized weakness.  Lethargy.  History dementia. EXAM: CT HEAD WITHOUT CONTRAST TECHNIQUE: Contiguous axial images were obtained from the base of  the skull through the vertex without intravenous contrast. COMPARISON:  06/20/2017 FINDINGS: Brain: Mild age-appropriate atrophy with sulcal prominence centralized volume loss with commensurate ex vacuo dilatation of the ventricular system. Periventricular hypodensities compatible microvascular ischemic disease. Given background parenchymal abnormalities, there is no CT evidence of superimposed acute large territory infarct. No intraparenchymal or extra-axial mass or hemorrhage. Unchanged size and configuration of the ventricles and the basilar cisterns with note is again made of a cavum septum pellucidum. No midline shift. Vascular: Intracranial atherosclerosis. Skull: No displaced calvarial fracture. Sinuses/Orbits: Limited visualization of the paranasal sinuses and mastoid air cells is normal. No air-fluid levels. Post bilateral cataract surgery Other: Regional soft tissues appear normal. IMPRESSION: Similar findings of mild, likely age-appropriate, atrophy and microvascular ischemic disease without superimposed acute intracranial process. Electronically Signed   By: Simonne ComeJohn  Watts M.D.   On: 12/12/2017 12:07   Dg Chest Port 1 View  Result Date: 12/12/2017 CLINICAL DATA:  Altered mental status. Generalized  weakness and lethargy. EXAM: PORTABLE CHEST 1 VIEW COMPARISON:  PA and lateral chest 04/19/2011. FINDINGS: Trace left pleural effusion and mild basilar atelectasis appear unchanged. Lung volumes are low. Heart size is upper normal. No pneumothorax. Aortic atherosclerosis is identified. IMPRESSION: No acute disease. Trace left pleural effusion and mild atelectasis are unchanged in appearance since 2012. Electronically Signed   By: Drusilla Kannerhomas  Dalessio M.D.   On: 12/12/2017 10:23    Procedures Procedures (including critical care time)  Medications Ordered in ED Medications  0.9 %  sodium chloride infusion ( Intravenous New Bag/Given 12/12/17 1304)  sodium chloride 0.9 % bolus 250 mL (0 mLs Intravenous Stopped 12/12/17 1345)  sodium chloride 0.9 % bolus 250 mL (0 mLs Intravenous Stopped 12/12/17 1302)     Initial Impression / Assessment and Plan / ED Course  I have reviewed the triage vital signs and the nursing notes.  Pertinent labs & imaging results that were available during my care of the patient were reviewed by me and considered in my medical decision making (see chart for details).     Patient's alertness has improved significantly here.  When patient first arrived she was nonverbal.  Work-up for the altered mental status without any significant findings.  To include CT head chest x-ray lab work and urinalysis which was negative for urinary tract infection.  However by this afternoon patient was much more alert still confused and she is on a dementia unit.  Patient is a full code.  Feel that patient was perhaps sedated.  Patient's son did confirm that she was receiving a sedative to the past 2 days and I but this is responsible for her depressed mental status upon arrival here.  Patient moving all 4 extremities.  No evidence of any significant focal neuro deficits.  No evidence of any significant stroke.  Do not feel clinically MRI is indicated at this time.  Patient is on a dementia unit at a  nursing home.  Patient stable for discharge back home.  Patient did show some signs of some dehydration and she was hydrated here.  But it was not severe dehydration.  According to patient's he feels that she is still somewhat confused but does agree that she is more alert.  Final Clinical Impressions(s) / ED Diagnoses   Final diagnoses:  Weakness  Altered mental status, unspecified altered mental status type  Dehydration    ED Discharge Orders    None       Vanetta MuldersZackowski, Kelsi Benham, MD 12/12/17 1643

## 2017-12-12 NOTE — ED Notes (Signed)
Dr z back in to talk with family

## 2017-12-12 NOTE — ED Notes (Signed)
Pt still waiting for ptar to transport back  Pt sleeping

## 2017-12-12 NOTE — ED Notes (Signed)
Pt still waiting for ptar 

## 2017-12-13 ENCOUNTER — Other Ambulatory Visit: Payer: Self-pay | Admitting: Nurse Practitioner

## 2017-12-13 LAB — URINE CULTURE: Culture: NO GROWTH

## 2017-12-16 ENCOUNTER — Emergency Department (HOSPITAL_COMMUNITY): Payer: Medicare Other

## 2017-12-16 ENCOUNTER — Encounter (HOSPITAL_COMMUNITY): Payer: Self-pay

## 2017-12-16 ENCOUNTER — Emergency Department (HOSPITAL_COMMUNITY)
Admission: EM | Admit: 2017-12-16 | Discharge: 2017-12-16 | Disposition: A | Discharge status: Skilled Nursing Facility | Payer: Medicare Other | Attending: Emergency Medicine | Admitting: Emergency Medicine

## 2017-12-16 DIAGNOSIS — Z79899 Other long term (current) drug therapy: Secondary | ICD-10-CM | POA: Diagnosis not present

## 2017-12-16 DIAGNOSIS — F0280 Dementia in other diseases classified elsewhere without behavioral disturbance: Secondary | ICD-10-CM | POA: Insufficient documentation

## 2017-12-16 DIAGNOSIS — M79632 Pain in left forearm: Secondary | ICD-10-CM | POA: Insufficient documentation

## 2017-12-16 DIAGNOSIS — Y999 Unspecified external cause status: Secondary | ICD-10-CM | POA: Diagnosis not present

## 2017-12-16 DIAGNOSIS — Z7982 Long term (current) use of aspirin: Secondary | ICD-10-CM | POA: Insufficient documentation

## 2017-12-16 DIAGNOSIS — M25551 Pain in right hip: Secondary | ICD-10-CM | POA: Insufficient documentation

## 2017-12-16 DIAGNOSIS — W19XXXA Unspecified fall, initial encounter: Secondary | ICD-10-CM | POA: Insufficient documentation

## 2017-12-16 DIAGNOSIS — M545 Low back pain: Secondary | ICD-10-CM | POA: Diagnosis not present

## 2017-12-16 DIAGNOSIS — R5383 Other fatigue: Secondary | ICD-10-CM | POA: Insufficient documentation

## 2017-12-16 DIAGNOSIS — Y92129 Unspecified place in nursing home as the place of occurrence of the external cause: Secondary | ICD-10-CM | POA: Insufficient documentation

## 2017-12-16 DIAGNOSIS — Y939 Activity, unspecified: Secondary | ICD-10-CM | POA: Diagnosis not present

## 2017-12-16 DIAGNOSIS — M542 Cervicalgia: Secondary | ICD-10-CM | POA: Insufficient documentation

## 2017-12-16 DIAGNOSIS — M549 Dorsalgia, unspecified: Secondary | ICD-10-CM | POA: Diagnosis not present

## 2017-12-16 DIAGNOSIS — G309 Alzheimer's disease, unspecified: Secondary | ICD-10-CM | POA: Diagnosis not present

## 2017-12-16 DIAGNOSIS — R2681 Unsteadiness on feet: Secondary | ICD-10-CM | POA: Insufficient documentation

## 2017-12-16 DIAGNOSIS — M25512 Pain in left shoulder: Secondary | ICD-10-CM | POA: Insufficient documentation

## 2017-12-16 MED ORDER — ACETAMINOPHEN ER 650 MG PO TBCR: 650.0000 mg | EXTENDED_RELEASE_TABLET | Freq: Three times a day (TID) | ORAL | 0 refills | Status: AC

## 2017-12-16 MED ORDER — ACETAMINOPHEN 325 MG PO TABS
650.0000 mg | ORAL_TABLET | Freq: Once | ORAL | Status: AC
  Administered 2017-12-16: 650 mg via ORAL
  Filled 2017-12-16: qty 2

## 2017-12-16 NOTE — ED Notes (Signed)
Bed: [id number] Expected date:  Expected time:  Means of arrival:  Comments: Fall, 84 yr old hip pain

## 2017-12-16 NOTE — ED Notes (Signed)
Patient able to ambulate in hallway with walker with two people assisting her. Provider aware.

## 2017-12-16 NOTE — ED Provider Notes (Signed)
[facility]-EMERGENCY DEPT Provider Note   CSN: [id number] Arrival date & time: 12/16/17  [id number]    History   Chief Complaint Chief Complaint  Patient presents with  . Hip Pain    right     HPI [name] is a [age] y.o. female with a hx of Alzheimer's disease, generalized osteoarthritis of multiple sites, BPPV, macular degeneration, and glaucoma who presents to the ED via EMS from [facility] s/p unwitnessed fall around 05:15 with complaints of R hip pain and L shoulder pain. Patient has dementia- baseline status is alert, conversational to a point, and ambulatory with walker. History provided by multiple parties: [facility]- patient was ambulating to the bathroom with her walker this AM when she had an unwitnessed fall, she appeared somewhat unsteady, staff does not believe she lost consciousness or syncopized, unsure definitively if there was head injury or not. She does take Aspirin, no other blood thinners. Per EMS- patient laying on floor upon their arrival, they were able to assist her in getting up, she was then able to ambulate to the stretcher and get on it without significant assistance. Per patient's son- she has been having trouble with increased sleepiness and being off balanced since recent addition of Trazodone and Lorazepam to her medications, they have decreased these doses recently, he is optimistic that this will help. Per patient- she is having pain to the L shoulder and the R hip. Somewhat limited secondary to dementia, however answering majority of questions and following commands. History limited secondary to baseline dementia.    HPI  Past Medical History:  Diagnosis Date  . Allergic rhinitis 07/05/2013  . Alzheimer's disease 07/05/2013  . Arthritis   . Benign paroxysmal positional vertigo 07/05/2013  . Generalized osteoarthritis of multiple sites 07/05/2013  . Glaucoma   . Hyperglycemia 09/22/2012  . Memory  loss 03/31/2013  . Unspecified constipation 03/31/2013    Patient Active Problem List   Diagnosis Date Noted  . Pseudophakia of both eyes 09/07/2014  . Primary open-angle glaucoma 02/28/2014  . Alzheimer's disease 07/05/2013  . Chronic constipation 07/05/2013  . Generalized osteoarthritis of multiple sites 07/05/2013  . Allergic rhinitis 07/05/2013  . Memory loss 03/31/2013  . Other and unspecified hyperlipidemia 03/31/2013  . Unspecified constipation 03/31/2013  . Dry skin 03/31/2013  . Hyperglycemia 09/22/2012  . Arthritis 08/10/2012  . Amblyopia, left eye 03/23/2012  . Branch retinal vein occlusion of left eye 03/23/2012  . Glaucoma filtering bleb, left eye 03/23/2012  . Macular degeneration 03/23/2012    Past Surgical History:  Procedure Laterality Date  . AMPUTATION FINGER / THUMB Right 1963   index  . EYE SURGERY Bilateral    catracts  . TONSILECTOMY/ADENOIDECTOMY WITH MYRINGOTOMY     as child     OB History   None      Home Medications    Prior to Admission medications   Medication Sig Start Date End Date Taking? Authorizing Provider  aspirin 81 MG chewable tablet Chew 81 mg by mouth daily.    [provider]  Calcium Carbonate-Vitamin D (CALTRATE 600+D) 600-400 MG-UNIT tablet Take 1 tablet by mouth 2 (two) times daily.    [provider]  latanoprost (XALATAN) 0.005 % ophthalmic solution Place 1 drop into both eyes at bedtime.     [provider]  LORazepam (ATIVAN) 0.5 MG tablet Take 0.5 mg by mouth 3 (three) times daily.    [provider]  Omega-3 Fatty Acids (FISH OIL PO) Take 1 capsule by mouth daily.     [provider]  timolol (TIMOPTIC) 0.5 % ophthalmic solution Place 1 drop into both eyes 2 (two) times daily.  08/03/14   [provider]  traZODone (DESYREL) 100 MG tablet Take 100 mg by mouth See admin instructions. Daily at 15:00    [provider]  traZODone (DESYREL) 150 MG tablet Take 75  mg by mouth at bedtime.    [provider]    Family History Family History  Problem Relation Age of Onset  . Cancer Mother   . Heart disease Father   . Heart disease Brother   . Heart disease Brother   . Heart disease Brother     Social History Social History   Tobacco Use  . Smoking status: Never Smoker  . Smokeless tobacco: Never Used  Substance Use Topics  . Alcohol use: No  . Drug use: No     Allergies   Aricept [donepezil hcl]; Namenda [memantine]; and Penicillins   Review of Systems Review of Systems  Unable to perform ROS: Dementia     Physical Exam Updated Vital Signs BP (!) 137/59   Pulse 63   Temp 97.9 F (36.6 C) (Axillary)   Resp 18   Ht 5' (1.524 m)   Wt 74.8 kg   SpO2 98%   BMI 32.21 kg/m   Physical Exam  Constitutional:  Non-toxic appearance. No distress.  HENT:  Head: Normocephalic and atraumatic. Head is without raccoon's eyes and without Battle's sign.  Right Ear: No drainage. No hemotympanum.  Left Ear: No drainage. No hemotympanum.  Nose: No rhinorrhea.  Mouth/Throat: Uvula is midline and oropharynx is clear and moist.  Cerumen present in bilateral EACs. Dentures in place.   Eyes: Pupils are equal, round, and reactive to light. Conjunctivae are normal. Right eye exhibits no discharge. Left eye exhibits no discharge.  Neck: Normal range of motion. Spinous process tenderness (diffuse, non focal) and muscular tenderness (L) present.  Cardiovascular: Normal rate and regular rhythm.  Pulses:      Radial pulses are 2+ on the right side, and 2+ on the left side.       Dorsalis pedis pulses are 2+ on the right side, and 2+ on the left side.  Pulmonary/Chest: Effort normal and breath sounds normal. No respiratory distress.  Abdominal: Soft. She exhibits no distension. There is no tenderness.  Musculoskeletal:  There is a small area of ecchymosis to the forearms dorsally- patient with recent ED visit- consistent with areas of  IV/blood draw attempts. No other areas of ecchymosis.  No obvious deformity, appreciable swelling, or open wounds.  Patient is moving all extremities somewhat. She is able to lift each of her legs off of the bed.  She is tender to palpation diffusely to the left upper extremity including the shoulder, elbow, and forearm, does not extend to wrist/hand, no snuff box tenderness.  Does not appear to be any point/focal bony tenderness palpable joint instability.  Right upper extremity is nontender.  Patient is tender diffusely about the right hip, seems to be more so laterally.  Lower extremities are otherwise nontender. Back: Patient is diffusely tender to the back including cervical, thoracic, and lumbar region, no point/focal tenderness or palpable step-off.  Neurological:  Alert.  Conversational at times.  Able to move all extremities. Answering questions and following commands.   Skin: Skin is warm and dry. No rash noted.  Psychiatric: She has  a normal mood and affect. Her behavior is normal.  Nursing note and vitals reviewed.   ED Treatments / Results  Labs (all labs ordered are listed, but only abnormal results are displayed) Labs Reviewed - No data to display  EKG None  Radiology Dg Thoracic Spine 2 View  Result Date: 12/16/2017 CLINICAL DATA:  Recent fall with upper back pain, initial encounter EXAM: THORACIC SPINE 2 VIEWS COMPARISON:  None. FINDINGS: Vertebral body height is well maintained. Multilevel osteophytic changes are noted. Pedicles are within normal limits and no paraspinal mass lesion is seen. No definitive rib abnormality is noted. IMPRESSION: Degenerative change without acute abnormality. Electronically Signed   By: Alcide Clever M.D.   On: 12/16/2017 08:18   Dg Lumbar Spine Complete  Result Date: 12/16/2017 CLINICAL DATA:  Fall.  Pain. EXAM: LUMBAR SPINE - COMPLETE 4+ VIEW COMPARISON:  05/17/2011. FINDINGS: Lumbar spine scoliosis concave right. Diffuse severe multilevel  degenerative change. No evidence of acute abnormality. No acute fracture noted. Aortoiliac atherosclerotic vascular calcification. IMPRESSION: Number lumbar scoliosis concave right. Diffuse severe multilevel degenerative change. No acute bony abnormality. 2.  Aortoiliac atherosclerotic vascular disease. Electronically Signed   By: Maisie Fus  Register   On: 12/16/2017 08:16   Dg Elbow Complete Left  Result Date: 12/16/2017 CLINICAL DATA:  Recent fall with left elbow pain, initial encounter EXAM: LEFT ELBOW - COMPLETE 3+ VIEW COMPARISON:  None. FINDINGS: There is no evidence of fracture, dislocation, or joint effusion. There is no evidence of arthropathy or other focal bone abnormality. Soft tissues are unremarkable. IMPRESSION: No acute abnormality noted. Electronically Signed   By: Alcide Clever M.D.   On: 12/16/2017 08:20   Dg Forearm Left  Result Date: 12/16/2017 CLINICAL DATA:  Recent fall with left forearm pain, initial encounter EXAM: LEFT FOREARM - 2 VIEW COMPARISON:  None. FINDINGS: The radius and ulna demonstrate no evidence of acute fracture or dislocation. Significant widening of the scapholunate space is noted of uncertain chronicity. IMPRESSION: No acute bony abnormality noted. Significant widening of the scapholunate space likely related to ligamentous injury. This is of uncertain chronicity. Electronically Signed   By: Alcide Clever M.D.   On: 12/16/2017 08:23   Ct Head Wo Contrast  Result Date: 12/16/2017 CLINICAL DATA:  Pain following fall EXAM: CT HEAD WITHOUT CONTRAST CT CERVICAL SPINE WITHOUT CONTRAST TECHNIQUE: Multidetector CT imaging of the head and cervical spine was performed following the standard protocol without intravenous contrast. Multiplanar CT image reconstructions of the cervical spine were also generated. COMPARISON:  Cervical spine CT June 20, 2017; head CT December 12, 2017 FINDINGS: CT HEAD FINDINGS Brain: There is moderate diffuse atrophy. There is a cavum septum  pellucidum, an anatomic variant. There is no intracranial mass, hemorrhage, extra-axial fluid collection, or midline shift. There is small vessel disease throughout the centra semiovale bilaterally. Elsewhere gray-white compartments appear unremarkable and stable. There is no demonstrable acute infarct. Vascular: There is no hyperdense vessel. There is calcification in each carotid siphon region. Skull: The bony calvarium appears intact. Sinuses/Orbits: There is mucosal thickening in several ethmoid air cells. Other paranasal sinuses which are visualized are clear. Orbits appear symmetric. Other: Mastoid air cells are clear. CT CERVICAL SPINE FINDINGS Alignment: There is 2 mm of retrolisthesis of C3 on C4. No other spondylolisthesis is evident. Skull base and vertebrae: Skull base and craniocervical junction regions appear normal. There is no demonstrable fracture. No blastic or lytic bone lesions are evident. Bones are osteoporotic. Soft tissues and spinal canal: Prevertebral soft  tissues and predental space regions are normal. No paraspinous lesion. There is no evident cord or canal hematoma. Disc levels: There is severe disc space narrowing at C3-4, C4-5, C5-6, and C6-7. There is moderate disc space narrowing at C7-T1. There is multilevel facet osteoarthritic change. There is exit foraminal narrowing due to bony hypertrophy at C3-4 bilaterally, C4-5 bilaterally, C5-6 bilaterally, and at C6-7 on the right. There is no frank disc extrusion or high-grade stenosis. Upper chest: Visualized upper lung zones are clear. Other: There are foci of subclavian and carotid artery calcification. IMPRESSION: CT head: Atrophy with supratentorial small vessel disease, stable. No acute infarct. No mass or hemorrhage. There are foci of arterial vascular calcification. There is mucosal thickening in several ethmoid air cells. CT cervical spine: No appreciable fracture. Slight spondylolisthesis at C3-4 is felt to be due to underlying  spondylosis. There is multilevel osteoarthritic change with exit foraminal narrowing at multiple levels. There are foci of vascular calcification in both carotid and subclavian arteries. Electronically Signed   By: Bretta Bang III M.D.   On: 12/16/2017 07:48   Ct Cervical Spine Wo Contrast  Result Date: 12/16/2017 CLINICAL DATA:  Pain following fall EXAM: CT HEAD WITHOUT CONTRAST CT CERVICAL SPINE WITHOUT CONTRAST TECHNIQUE: Multidetector CT imaging of the head and cervical spine was performed following the standard protocol without intravenous contrast. Multiplanar CT image reconstructions of the cervical spine were also generated. COMPARISON:  Cervical spine CT June 20, 2017; head CT December 12, 2017 FINDINGS: CT HEAD FINDINGS Brain: There is moderate diffuse atrophy. There is a cavum septum pellucidum, an anatomic variant. There is no intracranial mass, hemorrhage, extra-axial fluid collection, or midline shift. There is small vessel disease throughout the centra semiovale bilaterally. Elsewhere gray-white compartments appear unremarkable and stable. There is no demonstrable acute infarct. Vascular: There is no hyperdense vessel. There is calcification in each carotid siphon region. Skull: The bony calvarium appears intact. Sinuses/Orbits: There is mucosal thickening in several ethmoid air cells. Other paranasal sinuses which are visualized are clear. Orbits appear symmetric. Other: Mastoid air cells are clear. CT CERVICAL SPINE FINDINGS Alignment: There is 2 mm of retrolisthesis of C3 on C4. No other spondylolisthesis is evident. Skull base and vertebrae: Skull base and craniocervical junction regions appear normal. There is no demonstrable fracture. No blastic or lytic bone lesions are evident. Bones are osteoporotic. Soft tissues and spinal canal: Prevertebral soft tissues and predental space regions are normal. No paraspinous lesion. There is no evident cord or canal hematoma. Disc levels: There  is severe disc space narrowing at C3-4, C4-5, C5-6, and C6-7. There is moderate disc space narrowing at C7-T1. There is multilevel facet osteoarthritic change. There is exit foraminal narrowing due to bony hypertrophy at C3-4 bilaterally, C4-5 bilaterally, C5-6 bilaterally, and at C6-7 on the right. There is no frank disc extrusion or high-grade stenosis. Upper chest: Visualized upper lung zones are clear. Other: There are foci of subclavian and carotid artery calcification. IMPRESSION: CT head: Atrophy with supratentorial small vessel disease, stable. No acute infarct. No mass or hemorrhage. There are foci of arterial vascular calcification. There is mucosal thickening in several ethmoid air cells. CT cervical spine: No appreciable fracture. Slight spondylolisthesis at C3-4 is felt to be due to underlying spondylosis. There is multilevel osteoarthritic change with exit foraminal narrowing at multiple levels. There are foci of vascular calcification in both carotid and subclavian arteries. Electronically Signed   By: Bretta Bang III M.D.   On: 12/16/2017 07:48  Dg Shoulder Left  Result Date: 12/16/2017 CLINICAL DATA:  Recent fall with left shoulder pain, initial encounter EXAM: LEFT SHOULDER - 2+ VIEW COMPARISON:  None. FINDINGS: Degenerative changes of the acromioclavicular joint are seen. No acute fracture or dislocation is noted. The underlying rib cage is within normal limits. IMPRESSION: Degenerative change without acute abnormality. Electronically Signed   By: Alcide Clever M.D.   On: 12/16/2017 08:19   Dg Hip Unilat With Pelvis 2-3 Views Right  Result Date: 12/16/2017 CLINICAL DATA:  Recent fall with right-sided hip pain EXAM: DG HIP (WITH OR WITHOUT PELVIS) 3V RIGHT COMPARISON:  None. FINDINGS: The pelvic ring is intact. No acute fracture or dislocation is noted. Mild degenerative changes of the hip joints are noted bilaterally. Degenerative changes in the lumbar spine are noted as well.  IMPRESSION: No acute abnormality seen. Electronically Signed   By: Alcide Clever M.D.   On: 12/16/2017 08:18    Procedures Procedures (including critical care time)  Medications Ordered in ED Medications  acetaminophen (TYLENOL) tablet 650 mg (650 mg Oral Given 12/16/17 0912)     Initial Impression / Assessment and Plan / ED Course  I have reviewed the triage vital signs and the nursing notes.  Pertinent labs & imaging results that were available during my care of the patient were reviewed by me and considered in my medical decision making (see chart for details).   Patient with history of dementia presents from nursing facility after unwitnessed fall. Patient nontoxic appearing, resting fairly comfortably, vitals without significant abnormality overall. Patient answering questions, following commands, moving all extremities. Imaging obtained in areas of tenderness/concern. There does not appear to be any signs of serious injury at this time. No acute intracranial bleeds, fractures, or dislocations. She does have some widening of the scapholunate area on L forearm x-ray- patient having forearm pain as opposed to wrist pain, no tenderness to palpation over scapholunate area, do not suspect acute ligamentous injury. Patient able to ambulate with a walker that she is at baseline successfully in the ED.  Patient appears hemodynamically stable and safe for discharge back to her facility.  Tylenol ordered for pain. I discussed results, treatment plan, need for PCP follow-up, and return precautions with the patient and her son at bedside  Findings and plan of care discussed with supervising physician Dr. Anitra Lauth who personally evaluated and examined this patient and is in agreement.     Final Clinical Impressions(s) / ED Diagnoses   Final diagnoses:  Fall, initial encounter    ED Discharge Orders         Ordered    acetaminophen (TYLENOL 8 HOUR) 650 MG CR tablet  Every 8 hours     12/16/17  0940           Cherly Anderson, PA-C 12/16/17 1457    Gwyneth Sprout, MD 12/16/17 2039

## 2017-12-16 NOTE — Discharge Instructions (Signed)
[name] was seen in the emergency department after a fall.  Her imaging did not show fractures or dislocations.  It did not show a head bleed bleed.  Given her prescription for Tylenol as it would like her to have this every 8 hours for the next 2 days to help with the soreness.  Please have her follow-up with your primary care provider within 3 days for reassessment.  Please return to the ER for new or worsening symptoms including but not limited to increased pain, patient not wanting to bear weight, patient not moving the leg, or any other concerns.

## 2017-12-16 NOTE — ED Notes (Signed)
ED Provider at bedside. 

## 2017-12-16 NOTE — ED Notes (Signed)
Patient transported to CT and XR 

## 2017-12-16 NOTE — ED Notes (Signed)
[facility] has been contacted regarding patient transport.  

## 2017-12-16 NOTE — ED Triage Notes (Signed)
Pt arrived by EMS from [facility]. EMS reports that Pt had an unwitnessed fall around 0515. Pt has complaint of right hip pain. Pt was able to walk after falling with no complications. Pt has hx of Dementia.

## 2017-12-17 LAB — CULTURE, BLOOD (ROUTINE X 2)
CULTURE: NO GROWTH
CULTURE: NO GROWTH
SPECIAL REQUESTS: ADEQUATE

## 2018-04-22 ENCOUNTER — Encounter (HOSPITAL_COMMUNITY): Payer: Self-pay | Admitting: Emergency Medicine

## 2018-04-22 ENCOUNTER — Emergency Department (HOSPITAL_COMMUNITY): Payer: Medicare Other

## 2018-04-22 ENCOUNTER — Emergency Department (HOSPITAL_COMMUNITY)
Admission: EM | Admit: 2018-04-22 | Discharge: 2018-04-22 | Disposition: A | Discharge status: Home / Self Care | Payer: Medicare Other | Attending: Emergency Medicine | Admitting: Emergency Medicine

## 2018-04-22 DIAGNOSIS — R197 Diarrhea, unspecified: Secondary | ICD-10-CM

## 2018-04-22 DIAGNOSIS — I714 Abdominal aortic aneurysm, without rupture, unspecified: Secondary | ICD-10-CM

## 2018-04-22 DIAGNOSIS — Z79899 Other long term (current) drug therapy: Secondary | ICD-10-CM | POA: Insufficient documentation

## 2018-04-22 DIAGNOSIS — J102 Influenza due to other identified influenza virus with gastrointestinal manifestations: Secondary | ICD-10-CM | POA: Diagnosis not present

## 2018-04-22 DIAGNOSIS — G309 Alzheimer's disease, unspecified: Secondary | ICD-10-CM | POA: Insufficient documentation

## 2018-04-22 DIAGNOSIS — Z7982 Long term (current) use of aspirin: Secondary | ICD-10-CM | POA: Insufficient documentation

## 2018-04-22 DIAGNOSIS — J111 Influenza due to unidentified influenza virus with other respiratory manifestations: Secondary | ICD-10-CM

## 2018-04-22 DIAGNOSIS — R112 Nausea with vomiting, unspecified: Secondary | ICD-10-CM | POA: Diagnosis present

## 2018-04-22 LAB — URINALYSIS, ROUTINE W REFLEX MICROSCOPIC
Bilirubin Urine: NEGATIVE
Glucose, UA: NEGATIVE mg/dL
HGB URINE DIPSTICK: NEGATIVE
Ketones, ur: NEGATIVE mg/dL
LEUKOCYTES UA: NEGATIVE
NITRITE: NEGATIVE
PROTEIN: NEGATIVE mg/dL
SPECIFIC GRAVITY, URINE: 1.016 (ref 1.005–1.030)
pH: 6 (ref 5.0–8.0)

## 2018-04-22 LAB — CBC
HCT: 31.2 % — ABNORMAL LOW (ref 36.0–46.0)
HEMOGLOBIN: 10 g/dL — AB (ref 12.0–15.0)
MCH: 31.7 pg (ref 26.0–34.0)
MCHC: 32.1 g/dL (ref 30.0–36.0)
MCV: 99 fL (ref 80.0–100.0)
NRBC: 0 % (ref 0.0–0.2)
Platelets: 183 10*3/uL (ref 150–400)
RBC: 3.15 MIL/uL — AB (ref 3.87–5.11)
RDW: 13.6 % (ref 11.5–15.5)
WBC: 8.6 10*3/uL (ref 4.0–10.5)

## 2018-04-22 LAB — COMPREHENSIVE METABOLIC PANEL
ALBUMIN: 3.4 g/dL — AB (ref 3.5–5.0)
ALT: 13 U/L (ref 0–44)
ANION GAP: 11 (ref 5–15)
AST: 20 U/L (ref 15–41)
Alkaline Phosphatase: 46 U/L (ref 38–126)
BUN: 39 mg/dL — ABNORMAL HIGH (ref 8–23)
CO2: 23 mmol/L (ref 22–32)
Calcium: 8.9 mg/dL (ref 8.9–10.3)
Chloride: 103 mmol/L (ref 98–111)
Creatinine, Ser: 1.3 mg/dL — ABNORMAL HIGH (ref 0.44–1.00)
GFR calc non Af Amer: 33 mL/min — ABNORMAL LOW (ref >60)
GFR, EST AFRICAN AMERICAN: 39 mL/min — AB (ref >60)
GLUCOSE: 110 mg/dL — AB (ref 70–99)
POTASSIUM: 4.4 mmol/L (ref 3.5–5.1)
SODIUM: 137 mmol/L (ref 135–145)
TOTAL PROTEIN: 6.8 g/dL (ref 6.5–8.1)
Total Bilirubin: 0.5 mg/dL (ref 0.3–1.2)

## 2018-04-22 LAB — INFLUENZA PANEL BY PCR (TYPE A & B)
INFLBPCR: NEGATIVE
Influenza A By PCR: POSITIVE — AB

## 2018-04-22 LAB — LIPASE, BLOOD: Lipase: 29 U/L (ref 11–51)

## 2018-04-22 LAB — I-STAT CG4 LACTIC ACID, ED: LACTIC ACID, VENOUS: 1.34 mmol/L (ref 0.5–1.9)

## 2018-04-22 MED ORDER — SODIUM CHLORIDE 0.9 % IV SOLN
INTRAVENOUS | Status: DC
  Administered 2018-04-22: 06:00:00 via INTRAVENOUS

## 2018-04-22 MED ORDER — IOPAMIDOL (ISOVUE-300) INJECTION 61%
100.0000 mL | Freq: Once | INTRAVENOUS | Status: AC | PRN
  Administered 2018-04-22: 80 mL via INTRAVENOUS

## 2018-04-22 MED ORDER — SODIUM CHLORIDE (PF) 0.9 % IJ SOLN
INTRAMUSCULAR | Status: AC
  Filled 2018-04-22: qty 50

## 2018-04-22 MED ORDER — IOPAMIDOL (ISOVUE-300) INJECTION 61%
INTRAVENOUS | Status: AC
  Filled 2018-04-22: qty 100

## 2018-04-22 MED ORDER — ONDANSETRON HCL 4 MG/2ML IJ SOLN: 4.0000 mg | Freq: Once | INTRAMUSCULAR | Status: DC | PRN

## 2018-04-22 MED ORDER — ONDANSETRON HCL 4 MG/2ML IJ SOLN
4.0000 mg | Freq: Once | INTRAMUSCULAR | Status: AC
  Administered 2018-04-22: 4 mg via INTRAVENOUS
  Filled 2018-04-22: qty 2

## 2018-04-22 MED ORDER — OSELTAMIVIR PHOSPHATE 75 MG PO CAPS
75.0000 mg | ORAL_CAPSULE | Freq: Once | ORAL | Status: AC
  Administered 2018-04-22: 75 mg via ORAL
  Filled 2018-04-22: qty 1

## 2018-04-22 MED ORDER — OSELTAMIVIR PHOSPHATE 75 MG PO CAPS: 75.0000 mg | ORAL_CAPSULE | Freq: Two times a day (BID) | ORAL | 0 refills | Status: AC

## 2018-04-22 MED ORDER — SODIUM CHLORIDE 0.9 % IV BOLUS
500.0000 mL | Freq: Once | INTRAVENOUS | Status: AC
  Administered 2018-04-22: 500 mL via INTRAVENOUS

## 2018-04-22 NOTE — ED Triage Notes (Signed)
[facility] with complaints of N/V/D. Symptoms started at 0100. EMS states patient has vomited x 2 and had diarrhea since they picked her up. Patient has dementia. Patient unable to tell EMS her name.

## 2018-04-22 NOTE — ED Notes (Signed)
Bed: [id number] Expected date:  Expected time:  Means of arrival:  Comments: EMS [id number] female N/V/D fever

## 2018-04-22 NOTE — Discharge Instructions (Addendum)
Take the medications as prescribed, follow up with your doctor to be rechecked, return to the ED for worsening symptoms

## 2018-04-22 NOTE — ED Provider Notes (Signed)
[facility]-EMERGENCY DEPT Provider Note   CSN: [id number] Arrival date & time: 04/22/18  0457     History   Chief Complaint Chief Complaint  Patient presents with  . Nausea  . Emesis  . Diarrhea    HPI Kathy Mcclain is a [age] y.o. female.  Level 5 caveat for dementia.  Patient from her nursing facility with nausea, vomiting, diarrhea since about 1 AM.  She is febrile on arrival to 100.7.  EMS reports vomiting x2 and several episodes of diarrhea.  Patient unable to give any history.  She was apparently in her normal state of health yesterday.  She denies any abdominal pain.  Patient son at bedside.  States she has no other medical problems other than dementia.  There was a outbreak of the "flu" at her living facility last week and another floor was quarantined.  Patient has not been on antibiotics recently or traveled.  He does not know if anyone has had vomiting or diarrhea around her.  He denies any cardiac history.  The history is provided by the patient and the EMS personnel. The history is limited by the condition of the patient.  Emesis   Associated symptoms include diarrhea.  Diarrhea   Associated symptoms include vomiting.    Past Medical History:  Diagnosis Date  . Allergic rhinitis 07/05/2013  . Alzheimer's disease (HCC) 07/05/2013  . Arthritis   . Benign paroxysmal positional vertigo 07/05/2013  . Generalized osteoarthritis of multiple sites 07/05/2013  . Glaucoma   . Hyperglycemia 09/22/2012  . Memory loss 03/31/2013  . Unspecified constipation 03/31/2013    Patient Active Problem List   Diagnosis Date Noted  . Pseudophakia of both eyes 09/07/2014  . Primary open-angle glaucoma 02/28/2014  . Alzheimer's disease (HCC) 07/05/2013  . Chronic constipation 07/05/2013  . Generalized osteoarthritis of multiple sites 07/05/2013  . Allergic rhinitis 07/05/2013  . Memory loss 03/31/2013  . Other and unspecified hyperlipidemia 03/31/2013  .  Unspecified constipation 03/31/2013  . Dry skin 03/31/2013  . Hyperglycemia 09/22/2012  . Arthritis 08/10/2012  . Amblyopia, left eye 03/23/2012  . Branch retinal vein occlusion of left eye 03/23/2012  . Glaucoma filtering bleb, left eye 03/23/2012  . Macular degeneration 03/23/2012    Past Surgical History:  Procedure Laterality Date  . AMPUTATION FINGER / THUMB Right 1963   index  . EYE SURGERY Bilateral    catracts  . TONSILECTOMY/ADENOIDECTOMY WITH MYRINGOTOMY     as child     OB History   No obstetric history on file.      Home Medications    Prior to Admission medications   Medication Sig Start Date End Date Taking? Authorizing Provider  acetaminophen (TYLENOL 8 HOUR) 650 MG CR tablet Take 1 tablet (650 mg total) by mouth every 8 (eight) hours. 12/16/17   Petrucelli, Pleas KochSamantha R, PA-C  aspirin 81 MG chewable tablet Chew 81 mg by mouth daily.    [provider]  Calcium Carbonate-Vitamin D (CALTRATE 600+D) 600-400 MG-UNIT tablet Take 1 tablet by mouth 2 (two) times daily.    [provider]  latanoprost (XALATAN) 0.005 % ophthalmic solution Place 1 drop into both eyes at bedtime.     [provider]  LORazepam (ATIVAN) 0.5 MG tablet Take 0.5 mg by mouth 3 (three) times daily.    [provider]  Omega-3 Fatty Acids (FISH OIL PO) Take 1 capsule by mouth daily.     [provider]  timolol (TIMOPTIC)  0.5 % ophthalmic solution Place 1 drop into both eyes 2 (two) times daily.  08/03/14   [provider]  traZODone (DESYREL) 100 MG tablet Take 100 mg by mouth See admin instructions. Daily at 15:00    [provider]  traZODone (DESYREL) 150 MG tablet Take 75 mg by mouth at bedtime.    [provider]    Family History Family History  Problem Relation Age of Onset  . Cancer Mother   . Heart disease Father   . Heart disease Brother   . Heart disease Brother   . Heart disease Brother     Social  History Social History   Tobacco Use  . Smoking status: Never Smoker  . Smokeless tobacco: Never Used  Substance Use Topics  . Alcohol use: No  . Drug use: No     Allergies   Aricept [donepezil hcl]; Namenda [memantine]; and Penicillins   Review of Systems Review of Systems  Unable to perform ROS: Dementia  Gastrointestinal: Positive for diarrhea and vomiting.     Physical Exam Updated Vital Signs BP 119/60   Pulse 88   Temp (!) 100.7 F (38.2 C) (Rectal)   Resp 13   SpO2 96%   Physical Exam Vitals signs and nursing note reviewed.  Constitutional:      General: She is not in acute distress.    Appearance: She is well-developed.     Comments: Resting with eyes closed, arouses to her name and answers very few questions  HENT:     Head: Normocephalic and atraumatic.     Mouth/Throat:     Pharynx: No oropharyngeal exudate.     Comments: Mucous membranes mildly dry Eyes:     Conjunctiva/sclera: Conjunctivae normal.     Pupils: Pupils are equal, round, and reactive to light.  Neck:     Musculoskeletal: Normal range of motion and neck supple.     Comments: No meningismus. Cardiovascular:     Rate and Rhythm: Normal rate and regular rhythm.     Heart sounds: Normal heart sounds. No murmur.  Pulmonary:     Effort: Pulmonary effort is normal. No respiratory distress.     Breath sounds: Normal breath sounds.  Abdominal:     Palpations: Abdomen is soft.     Tenderness: There is no abdominal tenderness. There is no guarding or rebound.     Comments: Abdomen is soft and nontender  Musculoskeletal: Normal range of motion.        General: No tenderness.  Skin:    General: Skin is warm.  Neurological:     Mental Status: She is alert and oriented to person, place, and time.     Cranial Nerves: No cranial nerve deficit.     Motor: No abnormal muscle tone.     Coordination: Coordination normal.     Comments:  5/5 strength throughout. CN 2-12 intact.Equal grip strength.    Psychiatric:        Behavior: Behavior normal.      ED Treatments / Results  Labs (all labs ordered are listed, but only abnormal results are displayed) Labs Reviewed  COMPREHENSIVE METABOLIC PANEL - Abnormal; Notable for the following components:      Result Value   Glucose, Bld 110 (*)    BUN 39 (*)    Creatinine, Ser 1.30 (*)    Albumin 3.4 (*)    GFR calc non Af Amer 33 (*)    GFR calc Af Amer 39 (*)  All other components within normal limits  CBC - Abnormal; Notable for the following components:   RBC 3.15 (*)    Hemoglobin 10.0 (*)    HCT 31.2 (*)    All other components within normal limits  CULTURE, BLOOD (ROUTINE X 2)  CULTURE, BLOOD (ROUTINE X 2)  LIPASE, BLOOD  URINALYSIS, ROUTINE W REFLEX MICROSCOPIC  INFLUENZA PANEL BY PCR (TYPE A & B)  I-STAT CG4 LACTIC ACID, ED  I-STAT CG4 LACTIC ACID, ED    EKG None  Radiology No results found.  Procedures Procedures (including critical care time)  Medications Ordered in ED Medications  ondansetron (ZOFRAN) injection 4 mg (has no administration in time range)  sodium chloride 0.9 % bolus 500 mL (has no administration in time range)  0.9 %  sodium chloride infusion (has no administration in time range)  ondansetron (ZOFRAN) injection 4 mg (has no administration in time range)     Initial Impression / Assessment and Plan / ED Course  I have reviewed the triage vital signs and the nursing notes.  Pertinent labs & imaging results that were available during my care of the patient were reviewed by me and considered in my medical decision making (see chart for details).    Dementia patient with fever, vomiting, diarrhea since this morning.  Abdomen is soft without peritoneal signs.  Vital stable.  Fever of 100.7.  Patient given IV hydration and labs obtained.  Urinalysis is negative.  Her labs appear to be at baseline with her anemia.  As patient is unable to give a reasonable history, CT scan will be  obtained to evaluate for any intra-abdominal pathology.  Care will be transferred to Dr. Lynelle DoctorKnapp at shift change.  Anticipate discharge back with gastroenteritis if she is able to tolerate p.o. and imaging is reassuring. Final Clinical Impressions(s) / ED Diagnoses   Final diagnoses:  None    ED Discharge Orders    None       Romel Dumond, Jeannett SeniorStephen, MD 04/22/18 [phone]

## 2018-04-22 NOTE — ED Provider Notes (Signed)
Pt seen by Dr Manus Gunningancour.  Please see his note.  CT scan did not show any acute abnormality.  Incidental AAA noted.  At her age and condition, pt would not be a good candidate for surgical intervention.  I am not sure annual CT surveillance is necessary.  Influenza test was positive.  Will start the patient on tamiflu.  Vitals stable.  Non toxic.  Stable for dc back to nursing facility.   Linwood DibblesKnapp, Nashayla Telleria, MD 04/22/18 [phone]

## 2018-04-22 NOTE — ED Notes (Signed)
[facility] has picked this patient up.

## 2018-04-27 LAB — CULTURE, BLOOD (ROUTINE X 2)
CULTURE: NO GROWTH
Culture: NO GROWTH
SPECIAL REQUESTS: ADEQUATE
Special Requests: ADEQUATE

## 2018-06-01 ENCOUNTER — Emergency Department (HOSPITAL_COMMUNITY): Payer: Medicare Other

## 2018-06-01 ENCOUNTER — Other Ambulatory Visit: Payer: Self-pay

## 2018-06-01 ENCOUNTER — Encounter (HOSPITAL_COMMUNITY): Payer: Self-pay

## 2018-06-01 ENCOUNTER — Emergency Department (HOSPITAL_COMMUNITY)
Admission: EM | Admit: 2018-06-01 | Discharge: 2018-06-01 | Disposition: A | Discharge status: Home / Self Care | Payer: Medicare Other | Attending: Emergency Medicine | Admitting: Emergency Medicine

## 2018-06-01 DIAGNOSIS — F0280 Dementia in other diseases classified elsewhere without behavioral disturbance: Secondary | ICD-10-CM | POA: Diagnosis not present

## 2018-06-01 DIAGNOSIS — R531 Weakness: Secondary | ICD-10-CM

## 2018-06-01 DIAGNOSIS — G309 Alzheimer's disease, unspecified: Secondary | ICD-10-CM | POA: Insufficient documentation

## 2018-06-01 DIAGNOSIS — Y92128 Other place in nursing home as the place of occurrence of the external cause: Secondary | ICD-10-CM | POA: Insufficient documentation

## 2018-06-01 DIAGNOSIS — Z79899 Other long term (current) drug therapy: Secondary | ICD-10-CM | POA: Insufficient documentation

## 2018-06-01 DIAGNOSIS — Z7982 Long term (current) use of aspirin: Secondary | ICD-10-CM | POA: Diagnosis not present

## 2018-06-01 DIAGNOSIS — Y999 Unspecified external cause status: Secondary | ICD-10-CM | POA: Insufficient documentation

## 2018-06-01 DIAGNOSIS — Y939 Activity, unspecified: Secondary | ICD-10-CM | POA: Diagnosis not present

## 2018-06-01 DIAGNOSIS — W19XXXA Unspecified fall, initial encounter: Secondary | ICD-10-CM | POA: Insufficient documentation

## 2018-06-01 LAB — BASIC METABOLIC PANEL
ANION GAP: 9 (ref 5–15)
BUN: 31 mg/dL — AB (ref 8–23)
CALCIUM: 9.9 mg/dL (ref 8.9–10.3)
CO2: 24 mmol/L (ref 22–32)
Chloride: 104 mmol/L (ref 98–111)
Creatinine, Ser: 1.39 mg/dL — ABNORMAL HIGH (ref 0.44–1.00)
GFR, EST AFRICAN AMERICAN: 36 mL/min — AB (ref >60)
GFR, EST NON AFRICAN AMERICAN: 31 mL/min — AB (ref >60)
Glucose, Bld: 92 mg/dL (ref 70–99)
Potassium: 4.5 mmol/L (ref 3.5–5.1)
SODIUM: 137 mmol/L (ref 135–145)

## 2018-06-01 LAB — CBC WITH DIFFERENTIAL/PLATELET
Abs Immature Granulocytes: 0.02 10*3/uL (ref 0.00–0.07)
BASOS PCT: 0 %
Basophils Absolute: 0 10*3/uL (ref 0.0–0.1)
EOS ABS: 0.4 10*3/uL (ref 0.0–0.5)
EOS PCT: 6 %
HCT: 35.5 % — ABNORMAL LOW (ref 36.0–46.0)
HEMOGLOBIN: 11.2 g/dL — AB (ref 12.0–15.0)
Immature Granulocytes: 0 %
Lymphocytes Relative: 25 %
Lymphs Abs: 1.7 10*3/uL (ref 0.7–4.0)
MCH: 30.9 pg (ref 26.0–34.0)
MCHC: 31.5 g/dL (ref 30.0–36.0)
MCV: 98.1 fL (ref 80.0–100.0)
MONO ABS: 0.8 10*3/uL (ref 0.1–1.0)
Monocytes Relative: 12 %
Neutro Abs: 3.9 10*3/uL (ref 1.7–7.7)
Neutrophils Relative %: 57 %
PLATELETS: 208 10*3/uL (ref 150–400)
RBC: 3.62 MIL/uL — AB (ref 3.87–5.11)
RDW: 13.9 % (ref 11.5–15.5)
WBC: 6.9 10*3/uL (ref 4.0–10.5)
nRBC: 0 % (ref 0.0–0.2)

## 2018-06-01 NOTE — ED Notes (Signed)
Bed: [facility] Expected date:  Expected time:  Means of arrival:  Comments: EMS- 85yo F, fell out of chair

## 2018-06-01 NOTE — ED Notes (Signed)
Patient transported to X-ray 

## 2018-06-01 NOTE — ED Notes (Signed)
[facility] called and son has signed d/c paperwork

## 2018-06-01 NOTE — ED Notes (Signed)
Unable to complete Triage due to patient's diagnosis of alzheimer's.

## 2018-06-01 NOTE — ED Triage Notes (Signed)
Per EMS- patient is a resident of [facility] [phone]).  Patient slipped out of her chair at breakfast and when staff arrived, patient was sitting upright on the floor. Staff not sure if she hit her head or not. No injuries or bruising noted. bilateral hands contracted.

## 2018-06-01 NOTE — ED Provider Notes (Signed)
[facility]-EMERGENCY DEPT Provider Note   CSN: [id number] Arrival date & time: 06/01/18  4098     History   Chief Complaint Chief Complaint  Patient presents with  . Fall    HPI Lislie Gohlke is a 85 y.o. female.  HPI Alert female with dementia presents from a nursing facility. Level 5 caveat secondary to dementia. Per report, the patient was sitting in her chair, had an unwitnessed event, and staff found her sitting upright on the floor, concern for possible head trauma. The patient herself cannot provide useful details of her history, intermittently describes pain everywhere, then states that nothing hurts. Per report, EMS, no other notable recent changes. Past Medical History:  Diagnosis Date  . Allergic rhinitis 07/05/2013  . Alzheimer's disease (HCC) 07/05/2013  . Arthritis   . Benign paroxysmal positional vertigo 07/05/2013  . Generalized osteoarthritis of multiple sites 07/05/2013  . Glaucoma   . Hyperglycemia 09/22/2012  . Memory loss 03/31/2013  . Unspecified constipation 03/31/2013    Patient Active Problem List   Diagnosis Date Noted  . Pseudophakia of both eyes 09/07/2014  . Primary open-angle glaucoma 02/28/2014  . Alzheimer's disease (HCC) 07/05/2013  . Chronic constipation 07/05/2013  . Generalized osteoarthritis of multiple sites 07/05/2013  . Allergic rhinitis 07/05/2013  . Memory loss 03/31/2013  . Other and unspecified hyperlipidemia 03/31/2013  . Unspecified constipation 03/31/2013  . Dry skin 03/31/2013  . Hyperglycemia 09/22/2012  . Arthritis 08/10/2012  . Amblyopia, left eye 03/23/2012  . Branch retinal vein occlusion of left eye 03/23/2012  . Glaucoma filtering bleb, left eye 03/23/2012  . Macular degeneration 03/23/2012    Past Surgical History:  Procedure Laterality Date  . AMPUTATION FINGER / THUMB Right 1963   index  . EYE SURGERY Bilateral    catracts  . TONSILECTOMY/ADENOIDECTOMY WITH MYRINGOTOMY     as child      OB History   No obstetric history on file.      Home Medications    Prior to Admission medications   Medication Sig Start Date End Date Taking? Authorizing Provider  acetaminophen (TYLENOL 8 HOUR) 650 MG CR tablet Take 1 tablet (650 mg total) by mouth every 8 (eight) hours. 12/16/17  Yes Petrucelli, Samantha R, PA-C  acetaminophen (TYLENOL) 500 MG tablet Take 500 mg by mouth every 6 (six) hours as needed for fever.   Yes [provider]  alum & mag hydroxide-simeth (GERI-LANTA) 200-200-20 MG/5ML suspension Take 30 mLs by mouth every 6 (six) hours as needed for indigestion or heartburn (Do not exceed 4 doses in 24 hours).    Yes [provider]  aspirin 81 MG chewable tablet Chew 81 mg by mouth daily.   Yes [provider]  guaiFENesin (ROBITUSSIN) 100 MG/5ML SOLN Take 10 mLs by mouth every 6 (six) hours as needed for cough or to loosen phlegm.   Yes [provider]  latanoprost (XALATAN) 0.005 % ophthalmic solution Place 1 drop into both eyes at bedtime.    Yes [provider]  loperamide (IMODIUM A-D) 2 MG tablet Take 2 mg by mouth daily as needed for diarrhea or loose stools.    Yes [provider]  LORazepam (ATIVAN) 0.5 MG tablet Take 0.25 mg by mouth every 6 (six) hours as needed (agitation).    Yes [provider]  magnesium hydroxide (MILK OF MAGNESIA) 400 MG/5ML suspension Take 30 mLs by mouth at bedtime as needed for mild constipation.   Yes [provider]  neomycin-bacitracin-polymyxin (NEOSPORIN) ointment Apply 1 application topically as needed for wound care.   Yes [provider]  Omega-3 Fatty Acids (FISH OIL PO) Take 1 capsule by mouth daily.    Yes [provider]  risperiDONE (RISPERDAL) 0.5 MG tablet Take 0.5 mg by mouth daily at 12 noon.   Yes [provider]  timolol (TIMOPTIC) 0.5 % ophthalmic solution Place 1 drop into both eyes 2 (two) times daily.  08/03/14  Yes  [provider]  traZODone (DESYREL) 50 MG tablet Take 25-50 mg by mouth 2 (two) times daily. 25mg  at 3pm 50mg  at bedtime   Yes [provider]  Calcium Carbonate-Vitamin D (CALTRATE 600+D) 600-400 MG-UNIT tablet Take 1 tablet by mouth 2 (two) times daily.    [provider]  oseltamivir (TAMIFLU) 75 MG capsule Take 1 capsule (75 mg total) by mouth 2 (two) times daily. Patient not taking: Reported on 06/01/2018 04/22/18   Linwood DibblesKnapp, Jon, MD    Family History Family History  Problem Relation Age of Onset  . Cancer Mother   . Heart disease Father   . Heart disease Brother   . Heart disease Brother   . Heart disease Brother     Social History Social History   Tobacco Use  . Smoking status: Never Smoker  . Smokeless tobacco: Never Used  Substance Use Topics  . Alcohol use: No  . Drug use: No     Allergies   Aricept [donepezil hcl]; Namenda [memantine]; and Penicillins   Review of Systems Review of Systems  Unable to perform ROS: Dementia     Physical Exam Updated Vital Signs BP (!) 154/80 (BP Location: Right Arm)   Pulse 69   Temp 97.8 F (36.6 C) (Oral)   Resp 15   Ht 5\' 2"  (1.575 m)   Wt 61.2 kg   SpO2 100%   BMI 24.69 kg/m   Physical Exam Vitals signs and nursing note reviewed.  Constitutional:      General: She is not in acute distress.    Appearance: She is well-developed.     Comments: Elderly female sitting upright minimally interactive  HENT:     Head: Normocephalic and atraumatic.     Comments: No gross trauma, no deformity, no crepitus Eyes:     Conjunctiva/sclera: Conjunctivae normal.  Neck:     Comments: Patient does move the neck minimally laterally equally, as well as in AP dimension, though with limited range of motion, no gross deformities, no crepitus, patient states that she has pain all over Cardiovascular:     Rate and Rhythm: Normal rate and regular rhythm.  Pulmonary:     Effort: Pulmonary effort is normal. No  respiratory distress.     Breath sounds: Normal breath sounds. No stridor.  Abdominal:     General: There is no distension.  Musculoskeletal:     Comments: No appreciable pain with palpation of either hip, and with passive range of motion of both hips, both shoulders, no pain elicited, no complaints. No gross deformities in her extremities.  Skin:    General: Skin is warm and dry.  Neurological:     Mental Status: She is alert.     Cranial Nerves: No cranial nerve deficit.     Comments: Dementia is evident, diffuse atrophy, patient follows commands inconsistently, but does move all extremities spontaneously. She has contracture of both hands.  Psychiatric:        Cognition and Memory: Cognition is impaired.  ED Treatments / Results  Labs (all labs ordered are listed, but only abnormal results are displayed) Labs Reviewed  BASIC METABOLIC PANEL - Abnormal; Notable for the following components:      Result Value   BUN 31 (*)    Creatinine, Ser 1.39 (*)    GFR calc non Af Amer 31 (*)    GFR calc Af Amer 36 (*)    All other components within normal limits  CBC WITH DIFFERENTIAL/PLATELET - Abnormal; Notable for the following components:   RBC 3.62 (*)    Hemoglobin 11.2 (*)    HCT 35.5 (*)    All other components within normal limits  URINALYSIS, ROUTINE W REFLEX MICROSCOPIC    EKG None  Radiology Dg Chest 2 View  Result Date: 06/01/2018 CLINICAL DATA:  Slip and fall EXAM: CHEST - 2 VIEW COMPARISON:  04/22/2018 FINDINGS: Cardiac shadow is enlarged but stable. Aortic calcifications are again seen. Lungs are hyperinflated without focal confluent infiltrate. Degenerative changes of the thoracic spine are noted. IMPRESSION: No acute abnormality noted. Electronically Signed   By: Alcide Clever M.D.   On: 06/01/2018 09:51   Ct Head Wo Contrast  Result Date: 06/01/2018 CLINICAL DATA:  Fall with uncertain head injury.  Initial encounter. EXAM: CT HEAD WITHOUT CONTRAST CT CERVICAL  SPINE WITHOUT CONTRAST TECHNIQUE: Multidetector CT imaging of the head and cervical spine was performed following the standard protocol without intravenous contrast. Multiplanar CT image reconstructions of the cervical spine were also generated. COMPARISON:  12/16/2017 FINDINGS: CT HEAD FINDINGS Brain: No evidence of acute infarction, hemorrhage, hydrocephalus, extra-axial collection or mass effect. There is atrophy and chronic small vessel ischemia without interval progression. Partially calcified meningioma along the left frontal convexity without brain mass effect, 1 cm in maximal diameter and unchanged from prior. Two small remote right cerebellar infarcts. Vascular: No hyperdense vessel or unexpected calcification. Skull: Normal. Negative for fracture or focal lesion. Sinuses/Orbits: Bilateral cataract resection. Presumed glaucoma shunt tube on the left. CT CERVICAL SPINE FINDINGS Alignment: No traumatic malalignment. Mild listhesis at C3-4, C5-6, and C7-T1. Skull base and vertebrae: Negative for fracture Soft tissues and spinal canal: No prevertebral fluid or swelling. No visible canal hematoma. Disc levels: C3-4 to C6-7 advanced degenerative disc narrowing. Prominent retro dental ligament thickening without cervicomedullary mass effect. Upper chest: Negative motion degraded evaluation. IMPRESSION: No acute intracranial or cervical spine finding. Electronically Signed   By: Marnee Spring M.D.   On: 06/01/2018 09:47   Ct Cervical Spine Wo Contrast  Result Date: 06/01/2018 CLINICAL DATA:  Fall with uncertain head injury.  Initial encounter. EXAM: CT HEAD WITHOUT CONTRAST CT CERVICAL SPINE WITHOUT CONTRAST TECHNIQUE: Multidetector CT imaging of the head and cervical spine was performed following the standard protocol without intravenous contrast. Multiplanar CT image reconstructions of the cervical spine were also generated. COMPARISON:  12/16/2017 FINDINGS: CT HEAD FINDINGS Brain: No evidence of acute  infarction, hemorrhage, hydrocephalus, extra-axial collection or mass effect. There is atrophy and chronic small vessel ischemia without interval progression. Partially calcified meningioma along the left frontal convexity without brain mass effect, 1 cm in maximal diameter and unchanged from prior. Two small remote right cerebellar infarcts. Vascular: No hyperdense vessel or unexpected calcification. Skull: Normal. Negative for fracture or focal lesion. Sinuses/Orbits: Bilateral cataract resection. Presumed glaucoma shunt tube on the left. CT CERVICAL SPINE FINDINGS Alignment: No traumatic malalignment. Mild listhesis at C3-4, C5-6, and C7-T1. Skull base and vertebrae: Negative for fracture Soft tissues and spinal canal: No prevertebral fluid  or swelling. No visible canal hematoma. Disc levels: C3-4 to C6-7 advanced degenerative disc narrowing. Prominent retro dental ligament thickening without cervicomedullary mass effect. Upper chest: Negative motion degraded evaluation. IMPRESSION: No acute intracranial or cervical spine finding. Electronically Signed   By: Marnee SpringJonathon  Watts M.D.   On: 06/01/2018 09:47    Procedures Procedures (including critical care time)  Medications Ordered in ED Medications - No data to display   Initial Impression / Assessment and Plan / ED Course  I have reviewed the triage vital signs and the nursing notes.  Pertinent labs & imaging results that were available during my care of the patient were reviewed by me and considered in my medical decision making (see chart for details).    9:02 AM Patient accompanied by her son.  He notes the patient seems to be moving all extremities in a typical manner, did not complain about his exam of her either, but notes that over the past 2 days, weeks she has had substantial change in her condition, including decreased interactivity, ambulatory capacity, and frequent falls.   1:24 PM Patient in no distress, eating lunch, no  complaints. I discussed all findings with the patient's son, generally reassuring labs, CT, x-ray. Patient has not produced urine, but is afebrile, has no abdominal pain. Patient discharged to her nursing facility.  Final Clinical Impressions(s) / ED Diagnoses  Fall, initial encounter Weakness   Gerhard MunchLockwood, Chaim Gatley, MD 06/01/18 1326

## 2018-06-01 NOTE — Discharge Instructions (Signed)
As discussed, your evaluation today has been largely reassuring.  But, it is important that you monitor your condition carefully, and do not hesitate to return to the ED if you develop new, or concerning changes in your condition. ? ?Otherwise, please follow-up with your physician for appropriate ongoing care. ? ?

## 2018-06-13 ENCOUNTER — Emergency Department (HOSPITAL_COMMUNITY): Payer: Medicare Other

## 2018-06-13 ENCOUNTER — Emergency Department (HOSPITAL_COMMUNITY)
Admission: EM | Admit: 2018-06-13 | Discharge: 2018-06-13 | Disposition: A | Discharge status: Home / Self Care | Payer: Medicare Other | Attending: Emergency Medicine | Admitting: Emergency Medicine

## 2018-06-13 ENCOUNTER — Other Ambulatory Visit: Payer: Self-pay

## 2018-06-13 DIAGNOSIS — F0390 Unspecified dementia without behavioral disturbance: Secondary | ICD-10-CM | POA: Insufficient documentation

## 2018-06-13 DIAGNOSIS — R5383 Other fatigue: Secondary | ICD-10-CM | POA: Diagnosis not present

## 2018-06-13 DIAGNOSIS — R4182 Altered mental status, unspecified: Secondary | ICD-10-CM | POA: Diagnosis present

## 2018-06-13 DIAGNOSIS — Z7982 Long term (current) use of aspirin: Secondary | ICD-10-CM | POA: Insufficient documentation

## 2018-06-13 DIAGNOSIS — Z79899 Other long term (current) drug therapy: Secondary | ICD-10-CM | POA: Insufficient documentation

## 2018-06-13 LAB — URINALYSIS, ROUTINE W REFLEX MICROSCOPIC
Bilirubin Urine: NEGATIVE
Glucose, UA: NEGATIVE mg/dL
Hgb urine dipstick: NEGATIVE
Ketones, ur: NEGATIVE mg/dL
Leukocytes,Ua: NEGATIVE
Nitrite: NEGATIVE
Protein, ur: NEGATIVE mg/dL
SPECIFIC GRAVITY, URINE: 1.015 (ref 1.005–1.030)
pH: 7 (ref 5.0–8.0)

## 2018-06-13 LAB — I-STAT TROPONIN, ED: Troponin i, poc: 0.01 ng/mL (ref 0.00–0.08)

## 2018-06-13 LAB — CBC WITH DIFFERENTIAL/PLATELET
Abs Immature Granulocytes: 0.03 10*3/uL (ref 0.00–0.07)
Basophils Absolute: 0 10*3/uL (ref 0.0–0.1)
Basophils Relative: 0 %
EOS ABS: 0.3 10*3/uL (ref 0.0–0.5)
Eosinophils Relative: 5 %
HCT: 32.1 % — ABNORMAL LOW (ref 36.0–46.0)
Hemoglobin: 10.2 g/dL — ABNORMAL LOW (ref 12.0–15.0)
Immature Granulocytes: 0 %
Lymphocytes Relative: 21 %
Lymphs Abs: 1.6 10*3/uL (ref 0.7–4.0)
MCH: 31.1 pg (ref 26.0–34.0)
MCHC: 31.8 g/dL (ref 30.0–36.0)
MCV: 97.9 fL (ref 80.0–100.0)
Monocytes Absolute: 0.8 10*3/uL (ref 0.1–1.0)
Monocytes Relative: 11 %
Neutro Abs: 4.7 10*3/uL (ref 1.7–7.7)
Neutrophils Relative %: 63 %
PLATELETS: 180 10*3/uL (ref 150–400)
RBC: 3.28 MIL/uL — AB (ref 3.87–5.11)
RDW: 14 % (ref 11.5–15.5)
WBC: 7.5 10*3/uL (ref 4.0–10.5)
nRBC: 0 % (ref 0.0–0.2)

## 2018-06-13 LAB — COMPREHENSIVE METABOLIC PANEL
ALT: 13 U/L (ref 0–44)
AST: 19 U/L (ref 15–41)
Albumin: 3.2 g/dL — ABNORMAL LOW (ref 3.5–5.0)
Alkaline Phosphatase: 45 U/L (ref 38–126)
Anion gap: 8 (ref 5–15)
BUN: 32 mg/dL — ABNORMAL HIGH (ref 8–23)
CHLORIDE: 105 mmol/L (ref 98–111)
CO2: 24 mmol/L (ref 22–32)
CREATININE: 1.42 mg/dL — AB (ref 0.44–1.00)
Calcium: 9.7 mg/dL (ref 8.9–10.3)
GFR calc Af Amer: 35 mL/min — ABNORMAL LOW (ref >60)
GFR calc non Af Amer: 30 mL/min — ABNORMAL LOW (ref >60)
Glucose, Bld: 108 mg/dL — ABNORMAL HIGH (ref 70–99)
Potassium: 4.3 mmol/L (ref 3.5–5.1)
Sodium: 137 mmol/L (ref 135–145)
Total Bilirubin: 0.8 mg/dL (ref 0.3–1.2)
Total Protein: 6.7 g/dL (ref 6.5–8.1)

## 2018-06-13 NOTE — ED Provider Notes (Signed)
[facility] EMERGENCY DEPARTMENT Provider Note   CSN: [id number] Arrival date & time: 06/13/18  1347     History   Chief Complaint Chief Complaint  Patient presents with  . Altered Mental Status    HPI Kathy Mcclain is a 85 y.o. female.  HPI   85 year old female with a history of Alzheimer's and hyperlipidemia who presents with concern for fatigue and altered mental status.  Up until recently, patient had been functioning fairly well per son.  In the last week, she has been sleeping more, laying around.  Son reports that normally she would be up walking around early in the morning, but recently has been sleeping in late, has been very sleepy, sleeping through meals, not eating normally.  Reports she has had frequent falls, including a fall on the third for which she was brought to [facility], but she had also had a fall more recently.  Reports she did not appear to hit her head without fall, and they did not initially seek emergency department evaluation for it.  He is worried that something could have happened with the fall.  He also reports yesterday at lunch, she briefly appeared to have drooping of the left side of her face.  She did not have any other neurologic abnormalities at the time, including no numbness, weakness, no slurred speech.   Past Medical History:  Diagnosis Date  . Allergic rhinitis 07/05/2013  . Alzheimer's disease (HCC) 07/05/2013  . Arthritis   . Benign paroxysmal positional vertigo 07/05/2013  . Generalized osteoarthritis of multiple sites 07/05/2013  . Glaucoma   . Hyperglycemia 09/22/2012  . Memory loss 03/31/2013  . Unspecified constipation 03/31/2013    Patient Active Problem List   Diagnosis Date Noted  . Pseudophakia of both eyes 09/07/2014  . Primary open-angle glaucoma 02/28/2014  . Alzheimer's disease (HCC) 07/05/2013  . Chronic constipation 07/05/2013  . Generalized osteoarthritis of multiple sites 07/05/2013  . Allergic  rhinitis 07/05/2013  . Memory loss 03/31/2013  . Other and unspecified hyperlipidemia 03/31/2013  . Unspecified constipation 03/31/2013  . Dry skin 03/31/2013  . Hyperglycemia 09/22/2012  . Arthritis 08/10/2012  . Amblyopia, left eye 03/23/2012  . Branch retinal vein occlusion of left eye 03/23/2012  . Glaucoma filtering bleb, left eye 03/23/2012  . Macular degeneration 03/23/2012    Past Surgical History:  Procedure Laterality Date  . AMPUTATION FINGER / THUMB Right 1963   index  . EYE SURGERY Bilateral    catracts  . TONSILECTOMY/ADENOIDECTOMY WITH MYRINGOTOMY     as child     OB History   No obstetric history on file.      Home Medications    Prior to Admission medications   Medication Sig Start Date End Date Taking? Authorizing Provider  acetaminophen (TYLENOL 8 HOUR) 650 MG CR tablet Take 1 tablet (650 mg total) by mouth every 8 (eight) hours. 12/16/17  Yes Petrucelli, Samantha R, PA-C  aspirin 81 MG chewable tablet Chew 81 mg by mouth daily.   Yes [provider]  Calcium Carbonate-Vitamin D (CALTRATE 600+D) 600-400 MG-UNIT tablet Take 1 tablet by mouth 2 (two) times daily.   Yes [provider]  guaiFENesin (ROBITUSSIN) 100 MG/5ML SOLN Take 10 mLs by mouth every 6 (six) hours as needed for cough or to loosen phlegm.   Yes [provider]  neomycin-bacitracin-polymyxin (NEOSPORIN) ointment Apply 1 application topically as needed for wound care.   Yes [provider]  Omega-3 Fatty Acids (  FISH OIL PO) Take 1 capsule by mouth daily.    Yes [provider]  risperiDONE (RISPERDAL) 0.5 MG tablet Take 0.5 mg by mouth daily at 12 noon.   Yes [provider]  timolol (TIMOPTIC) 0.5 % ophthalmic solution Place 1 drop into both eyes 2 (two) times daily.  08/03/14  Yes [provider]  traZODone (DESYREL) 50 MG tablet Take 50 mg by mouth at bedtime.   Yes [provider]  acetaminophen (TYLENOL) 500 MG tablet  Take 500 mg by mouth every 6 (six) hours as needed for fever.    [provider]  alum & mag hydroxide-simeth (GERI-LANTA) 200-200-20 MG/5ML suspension Take 30 mLs by mouth every 6 (six) hours as needed for indigestion or heartburn (Do not exceed 4 doses in 24 hours).     [provider]  loperamide (IMODIUM A-D) 2 MG tablet Take 2 mg by mouth daily as needed for diarrhea or loose stools.     [provider]  LORazepam (ATIVAN) 0.5 MG tablet Take 0.25 mg by mouth every 6 (six) hours as needed (agitation).     [provider]  magnesium hydroxide (MILK OF MAGNESIA) 400 MG/5ML suspension Take 30 mLs by mouth at bedtime as needed for mild constipation.    [provider]  oseltamivir (TAMIFLU) 75 MG capsule Take 1 capsule (75 mg total) by mouth 2 (two) times daily. Patient not taking: Reported on 06/01/2018 04/22/18   Linwood DibblesKnapp, Jon, MD    Family History Family History  Problem Relation Age of Onset  . Cancer Mother   . Heart disease Father   . Heart disease Brother   . Heart disease Brother   . Heart disease Brother     Social History Social History   Tobacco Use  . Smoking status: Never Smoker  . Smokeless tobacco: Never Used  Substance Use Topics  . Alcohol use: No  . Drug use: No     Allergies   Aricept [donepezil hcl]; Namenda [memantine]; and Penicillins   Review of Systems Review of Systems  Unable to perform ROS: Dementia     Physical Exam Updated Vital Signs BP 131/81 (BP Location: Left Arm)   Pulse 79   Temp 98.6 F (37 C) (Rectal)   Resp 16   SpO2 99%   Physical Exam Vitals signs and nursing note reviewed.  Constitutional:      General: She is not in acute distress.    Appearance: She is well-developed. She is not diaphoretic.  HENT:     Head: Normocephalic and atraumatic.  Eyes:     Conjunctiva/sclera: Conjunctivae normal.  Neck:     Musculoskeletal: Normal range of motion.  Cardiovascular:     Rate and  Rhythm: Normal rate and regular rhythm.     Heart sounds: Normal heart sounds. No murmur. No friction rub. No gallop.   Pulmonary:     Effort: Pulmonary effort is normal. No respiratory distress.     Breath sounds: Normal breath sounds. No wheezing or rales.  Abdominal:     General: There is no distension.     Palpations: Abdomen is soft.     Tenderness: There is no abdominal tenderness. There is no guarding.  Musculoskeletal:        General: No tenderness.  Skin:    General: Skin is warm and dry.     Findings: No erythema or rash.  Neurological:     Mental Status: She is alert.  Comments: Clear speech Does not say name but recognizes son on his arrival and states his name, not able to state location or time Follows some commands but not reliably. Normal upper and lower extremity strength, symmetric face, normal EOM Does not participate in finger to nose but shows good coordination reaching for wires      ED Treatments / Results  Labs (all labs ordered are listed, but only abnormal results are displayed) Labs Reviewed  CBC WITH DIFFERENTIAL/PLATELET - Abnormal; Notable for the following components:      Result Value   RBC 3.28 (*)    Hemoglobin 10.2 (*)    HCT 32.1 (*)    All other components within normal limits  COMPREHENSIVE METABOLIC PANEL - Abnormal; Notable for the following components:   Glucose, Bld 108 (*)    BUN 32 (*)    Creatinine, Ser 1.42 (*)    Albumin 3.2 (*)    GFR calc non Af Amer 30 (*)    GFR calc Af Amer 35 (*)    All other components within normal limits  URINALYSIS, ROUTINE W REFLEX MICROSCOPIC - Abnormal; Notable for the following components:   APPearance HAZY (*)    All other components within normal limits  URINE CULTURE  I-STAT TROPONIN, ED    EKG EKG Interpretation  Date/Time:  Saturday June 13 2018 14:18:18 EST Ventricular Rate:  83 PR Interval:    QRS Duration: 88 QT Interval:  371 QTC Calculation: 436 R Axis:   46 Text  Interpretation:  Sinus rhythm Low voltage, precordial leads No significant change since last tracing Confirmed by Alvira Monday ([id number]) on 06/13/2018 3:36:12 PM   Radiology Dg Chest 2 View  Result Date: 06/13/2018 CLINICAL DATA:  Altered mental status EXAM: CHEST - 2 VIEW COMPARISON:  Chest x-rays dated 06/01/2018 and 04/22/2018. FINDINGS: Heart size and mediastinal contours are stable. Coarse lung markings bilaterally suggesting chronic interstitial lung disease. No new opacity to suggest a developing pneumonia. No pleural effusion seen. No acute or suspicious osseous finding. IMPRESSION: 1. No active cardiopulmonary disease. No evidence of pneumonia or pulmonary edema. 2. Probable chronic interstitial lung disease. Electronically Signed   By: Bary Richard M.D.   On: 06/13/2018 16:00   Ct Head Wo Contrast  Result Date: 06/13/2018 CLINICAL DATA:  Altered mental status. Declining status. History of Alzheimer's and dementia. EXAM: CT HEAD WITHOUT CONTRAST TECHNIQUE: Contiguous axial images were obtained from the base of the skull through the vertex without intravenous contrast. COMPARISON:  Head CT dated 06/01/2018. FINDINGS: Brain: Ventricles are stable in size and configuration. Chronic small vessel ischemic changes again noted within the bilateral periventricular and subcortical white matter regions. Evaluation of the brain is somewhat limited by patient motion artifact, however, there is no mass, hemorrhage, edema or other evidence of acute abnormality appreciated. No extra-axial hemorrhage seen. Vascular: Chronic calcified atherosclerotic changes of the large vessels at the skull base. No unexpected hyperdense vessel. Skull: Normal. Negative for fracture or focal lesion. Sinuses/Orbits: No acute finding. Other: None. IMPRESSION: 1. No acute findings. No intracranial mass, hemorrhage or edema. 2. Chronic small vessel ischemic changes in the white matter. Electronically Signed   By: Bary Richard M.D.    On: 06/13/2018 15:59    Procedures Procedures (including critical care time)  Medications Ordered in ED Medications - No data to display   Initial Impression / Assessment and Plan / ED Course  I have reviewed the triage vital signs and the nursing notes.  Pertinent  labs & imaging results that were available during my care of the patient were reviewed by me and considered in my medical decision making (see chart for details).     85 year old female with a history of Alzheimer's and hyperlipidemia who presents with concern for fatigue and altered mental status.  Patient well appearing, alert, with normal vital signs.  She has difficulty following some commands due to dementia but her neurologic exam is non-focal with good strength in the bilateral upper and lower extremities. Given her exam, I have low suspicion for stroke. CT head without acute abnormalities. Labs at baseline. No sign of UTI nor pneumonia.  No recent medication changes.  Discussed all care and results with son.  Do not see benefit of admission at this time and suspect her decline is likely secondary to dementia and aging, however recommend continued PCP follow up. Patient discharged in stable condition with understanding of reasons to return.   Final Clinical Impressions(s) / ED Diagnoses   Final diagnoses:  Other fatigue  Dementia without behavioral disturbance, unspecified dementia type [facility])    ED Discharge Orders    None       Alvira Monday, MD 06/13/18 1646

## 2018-06-13 NOTE — ED Triage Notes (Signed)
Pt to ED via EMS from [facility], normally alert to name and able to walk with a walker. Pt has been declining for the last week, wanting to stay in the bed. Yesterday son said he noticed facial droop, resolved now. Today at lunch had event where she didn't respond to staff and began leaning to her left. No fall or LOC. Hx alzheimer's and dementia.

## 2018-06-14 LAB — URINE CULTURE: Culture: <10000 — AB

## 2018-07-05 ENCOUNTER — Emergency Department (HOSPITAL_COMMUNITY): Payer: Medicare Other

## 2018-07-05 ENCOUNTER — Emergency Department (HOSPITAL_COMMUNITY)
Admission: EM | Admit: 2018-07-05 | Discharge: 2018-07-05 | Disposition: A | Discharge status: Home / Self Care | Payer: Medicare Other | Attending: Emergency Medicine | Admitting: Emergency Medicine

## 2018-07-05 ENCOUNTER — Encounter (HOSPITAL_COMMUNITY): Payer: Self-pay | Admitting: Internal Medicine

## 2018-07-05 DIAGNOSIS — Z89021 Acquired absence of right finger(s): Secondary | ICD-10-CM | POA: Insufficient documentation

## 2018-07-05 DIAGNOSIS — R55 Syncope and collapse: Secondary | ICD-10-CM | POA: Diagnosis not present

## 2018-07-05 DIAGNOSIS — Z7982 Long term (current) use of aspirin: Secondary | ICD-10-CM | POA: Insufficient documentation

## 2018-07-05 DIAGNOSIS — Z880 Allergy status to penicillin: Secondary | ICD-10-CM | POA: Diagnosis not present

## 2018-07-05 DIAGNOSIS — Z79899 Other long term (current) drug therapy: Secondary | ICD-10-CM | POA: Insufficient documentation

## 2018-07-05 DIAGNOSIS — G309 Alzheimer's disease, unspecified: Secondary | ICD-10-CM | POA: Diagnosis not present

## 2018-07-05 DIAGNOSIS — R4182 Altered mental status, unspecified: Secondary | ICD-10-CM | POA: Diagnosis present

## 2018-07-05 LAB — URINALYSIS, ROUTINE W REFLEX MICROSCOPIC
Bacteria, UA: NONE SEEN
Bilirubin Urine: NEGATIVE
Glucose, UA: NEGATIVE mg/dL
Hgb urine dipstick: NEGATIVE
Ketones, ur: NEGATIVE mg/dL
Nitrite: NEGATIVE
Protein, ur: NEGATIVE mg/dL
Specific Gravity, Urine: 1.012 (ref 1.005–1.030)
pH: 7 (ref 5.0–8.0)

## 2018-07-05 MED ORDER — LORAZEPAM 2 MG/ML IJ SOLN
0.5000 mg | Freq: Once | INTRAMUSCULAR | Status: AC
  Administered 2018-07-05: 0.5 mg via INTRAVENOUS
  Filled 2018-07-05: qty 1

## 2018-07-05 NOTE — ED Notes (Signed)
Pt returned from CT °

## 2018-07-05 NOTE — ED Triage Notes (Signed)
Patient here from [facility] skilled nursing facility for syncopal episodes that have happened 3 times in the last 3 weeks. Per staff, patient loses consciousness when standing up to transfer from bed to chair. During these episodes, she becomes hypoxic and has altered mental status for approx 15 minutes. Vitals stable at this time.

## 2018-07-05 NOTE — ED Notes (Signed)
Patient transported to CT 

## 2018-07-05 NOTE — ED Provider Notes (Signed)
[facility] EMERGENCY DEPARTMENT Provider Note   CSN: [id number] Arrival date & time: 07/05/18  1144    History   Chief Complaint Chief Complaint  Patient presents with  . Altered Mental Status    HPI Surveen Monsivais is a 85 y.o. female.  HPI   85 year old female brought in for possible syncopal event.  Patient is essentially bedbound.  Staff was getting her up to transfer today when she appeared to have a loss of consciousness and some shaking afterwards.  This has happened more than once recently.  Patient has a history of Alzheimer's.  She is selectively answering questions.  She tells me my hands are cold and when asked that she is having pain she says "not yet."  Past Medical History:  Diagnosis Date  . Allergic rhinitis 07/05/2013  . Alzheimer's disease (HCC) 07/05/2013  . Arthritis   . Benign paroxysmal positional vertigo 07/05/2013  . Generalized osteoarthritis of multiple sites 07/05/2013  . Glaucoma   . Hyperglycemia 09/22/2012  . Memory loss 03/31/2013  . Unspecified constipation 03/31/2013    Patient Active Problem List   Diagnosis Date Noted  . Pseudophakia of both eyes 09/07/2014  . Primary open-angle glaucoma 02/28/2014  . Alzheimer's disease (HCC) 07/05/2013  . Chronic constipation 07/05/2013  . Generalized osteoarthritis of multiple sites 07/05/2013  . Allergic rhinitis 07/05/2013  . Memory loss 03/31/2013  . Other and unspecified hyperlipidemia 03/31/2013  . Unspecified constipation 03/31/2013  . Dry skin 03/31/2013  . Hyperglycemia 09/22/2012  . Arthritis 08/10/2012  . Amblyopia, left eye 03/23/2012  . Branch retinal vein occlusion of left eye 03/23/2012  . Glaucoma filtering bleb, left eye 03/23/2012  . Macular degeneration 03/23/2012    Past Surgical History:  Procedure Laterality Date  . AMPUTATION FINGER / THUMB Right 1963   index  . EYE SURGERY Bilateral    catracts  . TONSILECTOMY/ADENOIDECTOMY WITH MYRINGOTOMY     as  child     OB History   No obstetric history on file.      Home Medications    Prior to Admission medications   Medication Sig Start Date End Date Taking? Authorizing Provider  acetaminophen (TYLENOL 8 HOUR) 650 MG CR tablet Take 1 tablet (650 mg total) by mouth every 8 (eight) hours. 12/16/17   Petrucelli, Samantha R, PA-C  acetaminophen (TYLENOL) 500 MG tablet Take 500 mg by mouth every 6 (six) hours as needed for fever.    [provider]  alum & mag hydroxide-simeth (GERI-LANTA) 200-200-20 MG/5ML suspension Take 30 mLs by mouth every 6 (six) hours as needed for indigestion or heartburn (Do not exceed 4 doses in 24 hours).     [provider]  aspirin 81 MG chewable tablet Chew 81 mg by mouth daily.    [provider]  Calcium Carbonate-Vitamin D (CALTRATE 600+D) 600-400 MG-UNIT tablet Take 1 tablet by mouth 2 (two) times daily.    [provider]  guaiFENesin (ROBITUSSIN) 100 MG/5ML SOLN Take 10 mLs by mouth every 6 (six) hours as needed for cough or to loosen phlegm.    [provider]  loperamide (IMODIUM A-D) 2 MG tablet Take 2 mg by mouth daily as needed for diarrhea or loose stools.     [provider]  LORazepam (ATIVAN) 0.5 MG tablet Take 0.25 mg by mouth every 6 (six) hours as needed (agitation).     [provider]  magnesium hydroxide (MILK OF MAGNESIA) 400 MG/5ML suspension Take 30 mLs  by mouth at bedtime as needed for mild constipation.    [provider]  neomycin-bacitracin-polymyxin (NEOSPORIN) ointment Apply 1 application topically as needed for wound care.    [provider]  Omega-3 Fatty Acids (FISH OIL PO) Take 1 capsule by mouth daily.     [provider]  oseltamivir (TAMIFLU) 75 MG capsule Take 1 capsule (75 mg total) by mouth 2 (two) times daily. Patient not taking: Reported on 06/01/2018 04/22/18   Linwood Dibbles, MD  risperiDONE (RISPERDAL) 0.5 MG tablet Take 0.5 mg by mouth daily  at 12 noon.    [provider]  timolol (TIMOPTIC) 0.5 % ophthalmic solution Place 1 drop into both eyes 2 (two) times daily.  08/03/14   [provider]  traZODone (DESYREL) 50 MG tablet Take 50 mg by mouth at bedtime.    [provider]    Family History Family History  Problem Relation Age of Onset  . Cancer Mother   . Heart disease Father   . Heart disease Brother   . Heart disease Brother   . Heart disease Brother     Social History Social History   Tobacco Use  . Smoking status: Never Smoker  . Smokeless tobacco: Never Used  Substance Use Topics  . Alcohol use: No  . Drug use: No     Allergies   Aricept [donepezil hcl]; Namenda [memantine]; and Penicillins   Review of Systems Review of Systems  Level 5 caveat because of dementia.  Physical Exam Updated Vital Signs BP (!) 181/86   Pulse 90   Temp 97.9 F (36.6 C) (Oral)   Resp 20   SpO2 100%   Physical Exam Vitals signs and nursing note reviewed.  Constitutional:      General: She is not in acute distress.    Appearance: She is well-developed.  HENT:     Head: Normocephalic and atraumatic.  Eyes:     General:        Right eye: No discharge.        Left eye: No discharge.     Conjunctiva/sclera: Conjunctivae normal.  Neck:     Musculoskeletal: Neck supple.  Cardiovascular:     Rate and Rhythm: Normal rate and regular rhythm.     Heart sounds: Normal heart sounds. No murmur. No friction rub. No gallop.   Pulmonary:     Effort: Pulmonary effort is normal. No respiratory distress.     Breath sounds: Normal breath sounds.  Abdominal:     General: There is no distension.     Palpations: Abdomen is soft.     Tenderness: There is no abdominal tenderness.  Musculoskeletal:        General: No tenderness.  Skin:    General: Skin is warm and dry.  Neurological:     Mental Status: She is alert.     Comments: Oriented to self only. Will follow some simple commands. Moves all  extremities.   Psychiatric:        Behavior: Behavior normal.        Thought Content: Thought content normal.      ED Treatments / Results  Labs (all labs ordered are listed, but only abnormal results are displayed) Labs Reviewed  URINALYSIS, ROUTINE W REFLEX MICROSCOPIC - Abnormal; Notable for the following components:      Result Value   Color, Urine STRAW (*)    Leukocytes,Ua TRACE (*)    All other components within normal limits  EKG EKG Interpretation  Date/Time:  Sunday July 05 2018 12:43:00 EDT Ventricular Rate:  87 PR Interval:  130 QRS Duration: 70 QT Interval:  350 QTC Calculation: 421 R Axis:   41 Text Interpretation:  Normal sinus rhythm Cannot rule out Anterior infarct , age undetermined Abnormal ECG Confirmed by Raeford Razor [phone]) on 07/05/2018 3:10:54 PM   Radiology Ct Head Wo Contrast  Result Date: 07/05/2018 CLINICAL DATA:  Syncope EXAM: CT HEAD WITHOUT CONTRAST TECHNIQUE: Contiguous axial images were obtained from the base of the skull through the vertex without intravenous contrast. COMPARISON:  06/13/2018 FINDINGS: Brain: No evidence of acute infarction, hemorrhage, hydrocephalus, extra-axial collection or mass lesion/mass effect. Periventricular white matter hypodensity and global volume loss. Incidental note of cavum septum pellucidum variant of the lateral ventricles. Vascular: No hyperdense vessel or unexpected calcification. Skull: Normal. Negative for fracture or focal lesion. Sinuses/Orbits: No acute finding. Other: None. IMPRESSION: No acute intracranial pathology. Small-vessel white matter disease and global volume loss in keeping with advanced patient age. Electronically Signed   By: Lauralyn Primes M.D.   On: 07/05/2018 13:43    Procedures Procedures (including critical care time)  Medications Ordered in ED Medications  LORazepam (ATIVAN) injection 0.5 mg (0.5 mg Intravenous Given 07/05/18 1352)    Initial Impression / Assessment and Plan /  ED Course  I have reviewed the triage vital signs and the nursing notes.  Pertinent labs & imaging results that were available during my care of the patient were reviewed by me and considered in my medical decision making (see chart for details).   85 year old woman presenting with what to me sounds like orthostatic syncope.  Therewere multiple attempts to obtain blood from her.   I'm not sure that continuing her to stick her for blood is worth the discomfort we're giving her.  She is requesting repeatedly to leave.  She is HD stable. Recent evaluations were reviewed.  Labs were fairly unremarkable at that time.  She is not the most reliable historian, for what is worth, she does not seem to have any acute complaints.  Her EKG today does not appear to be acutely changed.  CT the head without acute abnormality.  UA looks fine.  Final Clinical Impressions(s) / ED Diagnoses   Final diagnoses:  Syncope, unspecified syncope type    ED Discharge Orders    None       Raeford Razor, MD 07/05/18 1512

## 2018-07-05 NOTE — ED Notes (Signed)
PT[facility] bedside 

## 2018-07-05 NOTE — ED Notes (Signed)
Patient agitated, restless, and confused. Attempted to re-orient patient to reality unsuccessfully. MD aware.

## 2018-07-05 NOTE — ED Notes (Signed)
Attempted to call [facility] twice for report unsuccessfully. PTAR aware.

## 2018-08-28 DEATH — deceased

## 2020-08-23 IMAGING — DX DG CHEST 2V
2 series · 2 of 2 positions shown · non-contrast
Comparison: Chest x-rays dated 06/01/2018 and 04/22/2018.

CLINICAL DATA: Altered mental status

EXAM:
CHEST - 2 VIEW

[chest lat]
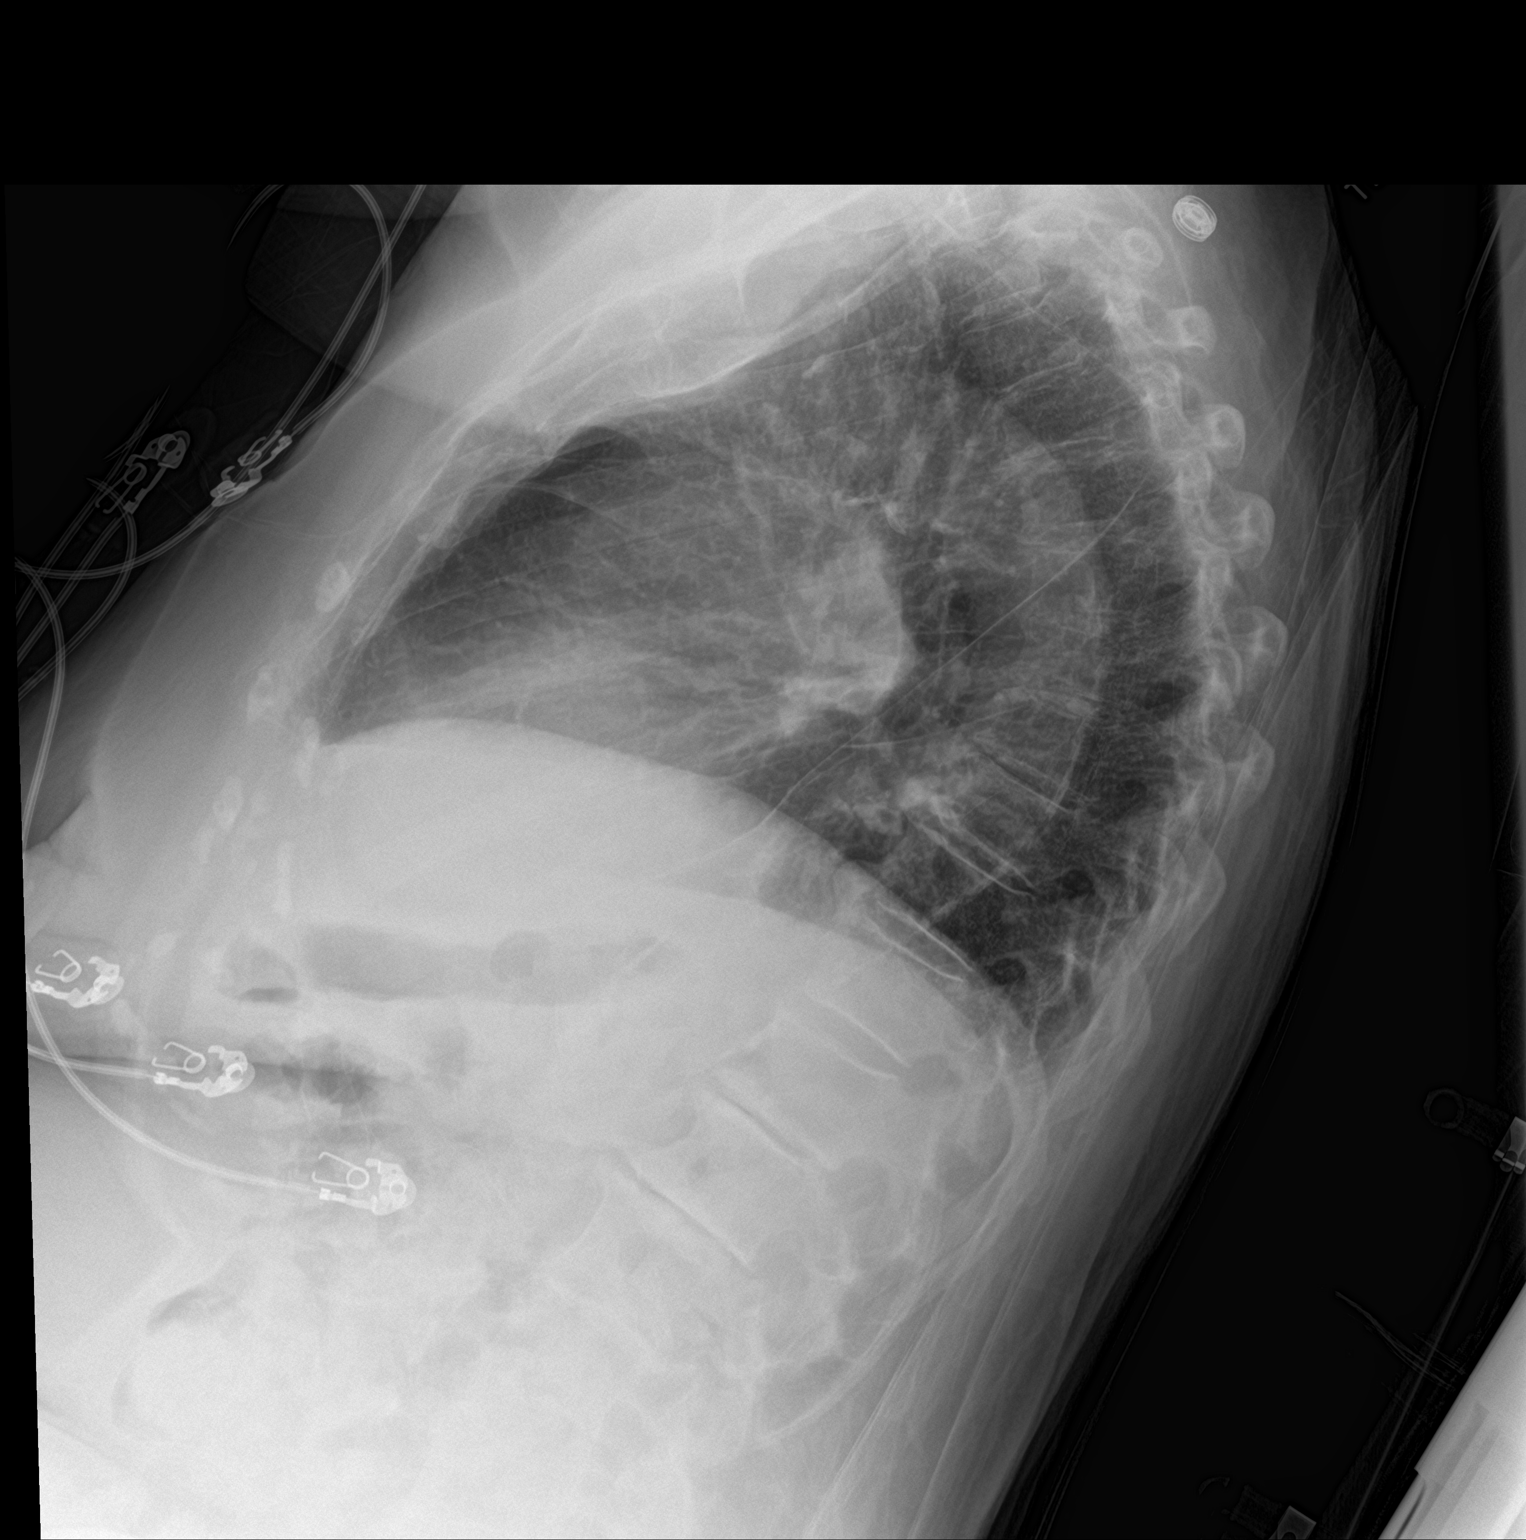

[chest ap]
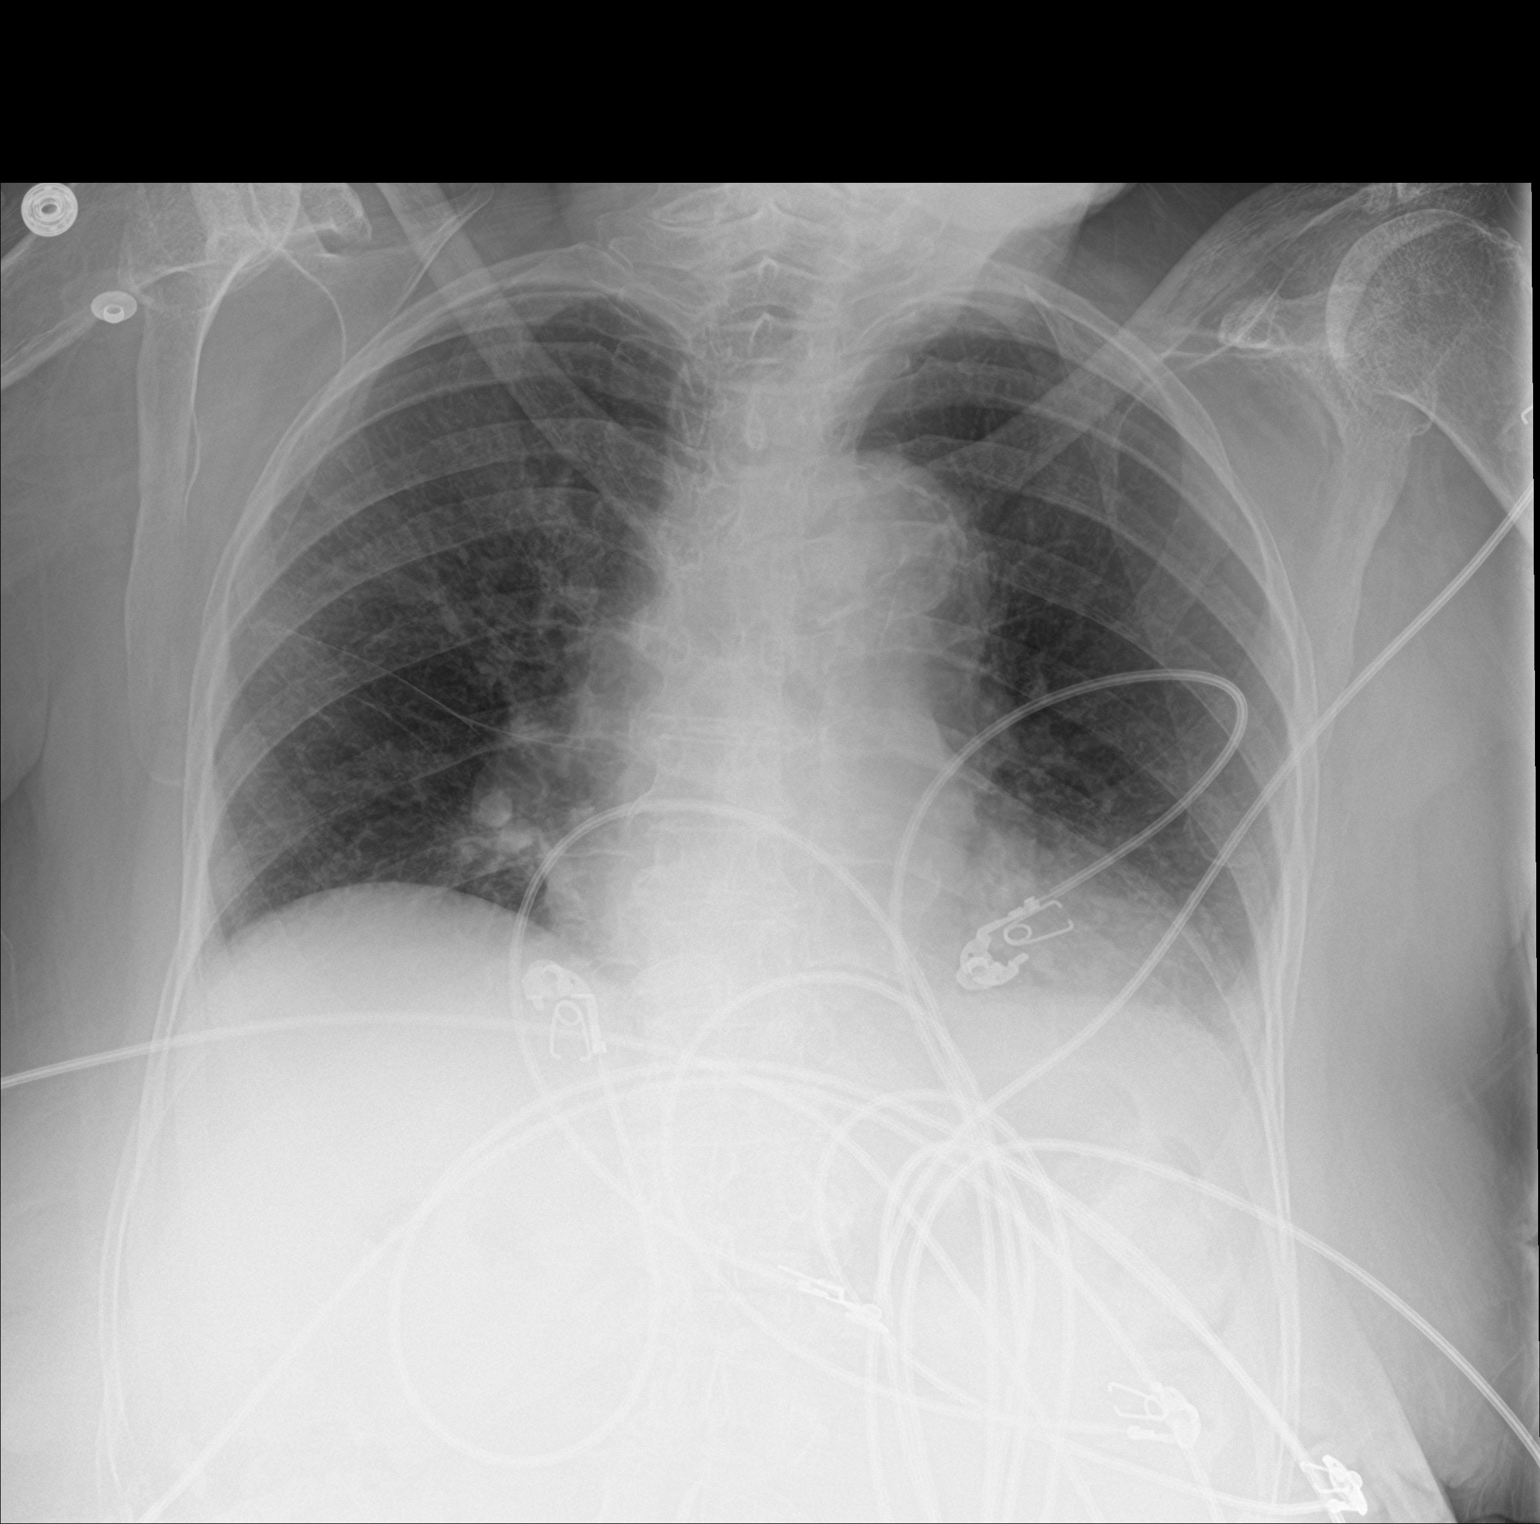

[2 of 2 positions shown; findings below may reference images not displayed]

FINDINGS: Heart size and mediastinal contours are stable. Coarse lung markings
bilaterally suggesting chronic interstitial lung disease. No new
opacity to suggest a developing pneumonia. No pleural effusion seen.
No acute or suspicious osseous finding.
IMPRESSION: 1. No active cardiopulmonary disease. No evidence of pneumonia or
pulmonary edema.
2. Probable chronic interstitial lung disease.

## 2020-08-23 IMAGING — CT CT HEAD W/O CM
3 of 9 series · 15 of 47 positions shown, 18 images · non-contrast
Comparison: Head CT dated 06/01/2018.

CLINICAL DATA: Altered mental status. Declining status. History of
Alzheimer's and dementia.

EXAM:
CT HEAD WITHOUT CONTRAST
TECHNIQUE: Contiguous axial images were obtained from the base of the skull
through the vertex without intravenous contrast.

[Series 4: head 5.0 h30s · axial · 0.42mm/px · z∈[-130,-0]mm · 12 of 30 slices shown, 15 images]
[im 2/30  brain]
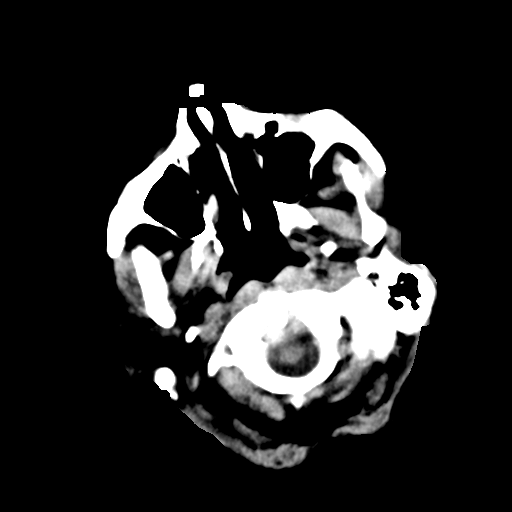
[im 2/30  bone]
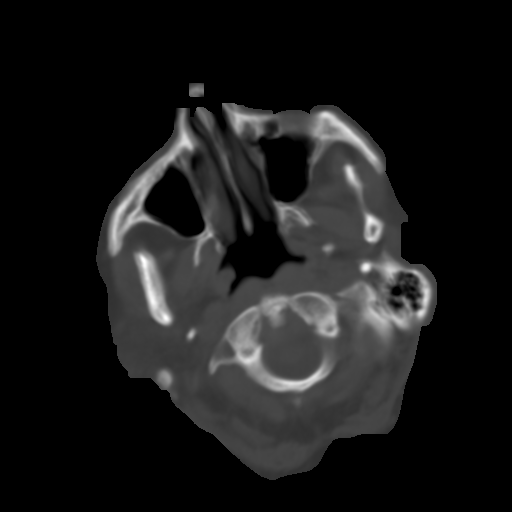
[im 4/30  brain]
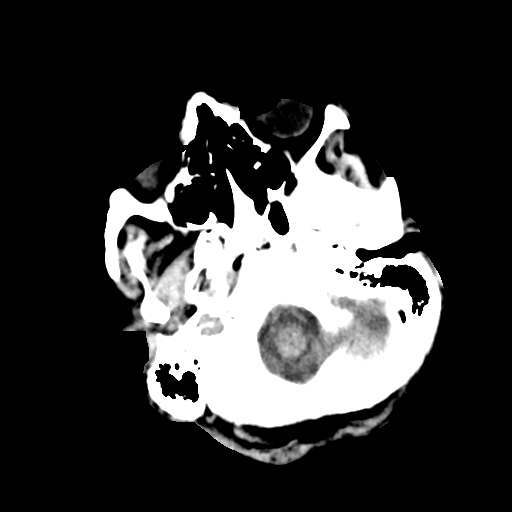
[im 8/30  brain]
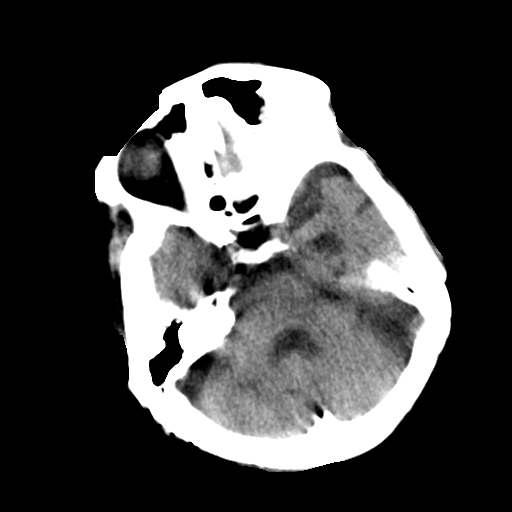
[im 10/30  brain]
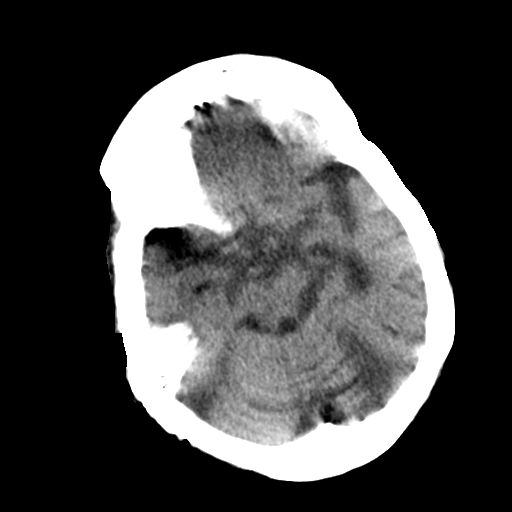
[im 11/30  brain]
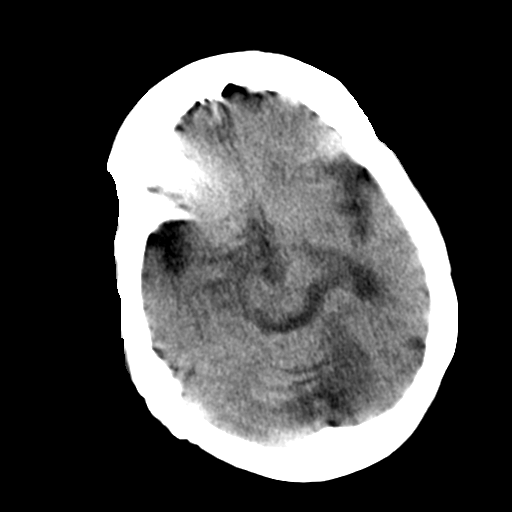
[im 11/30  bone]
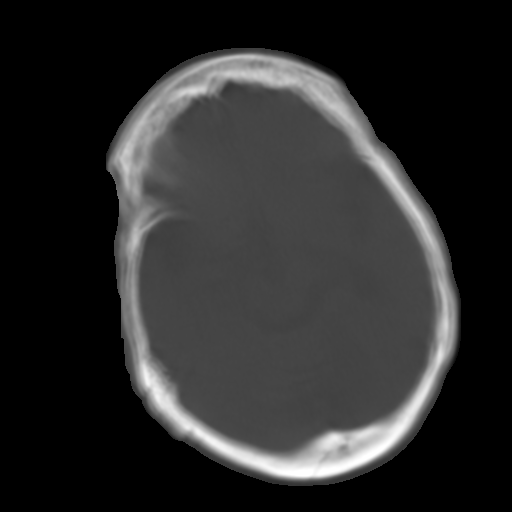
[im 13/30  brain]
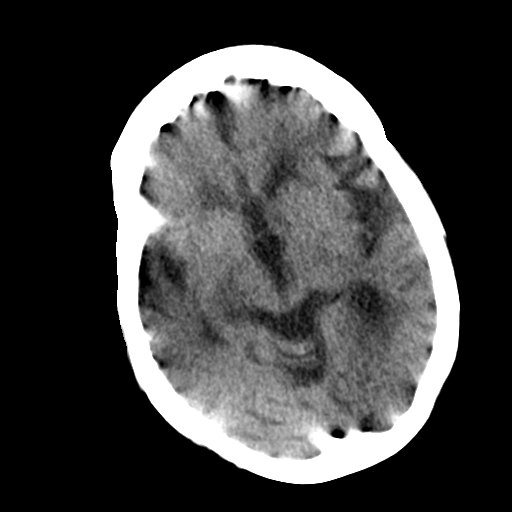
[im 17/30  brain]
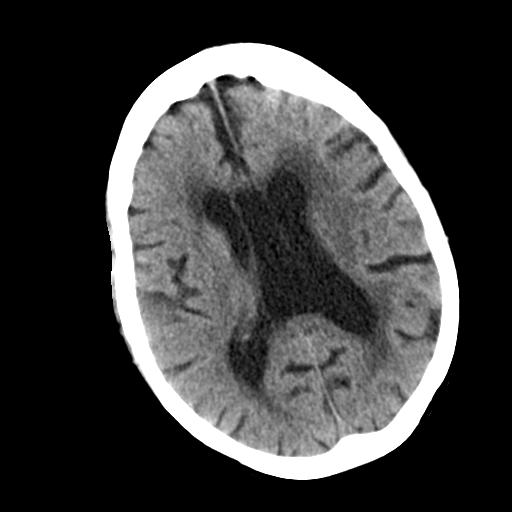
[im 19/30  brain]
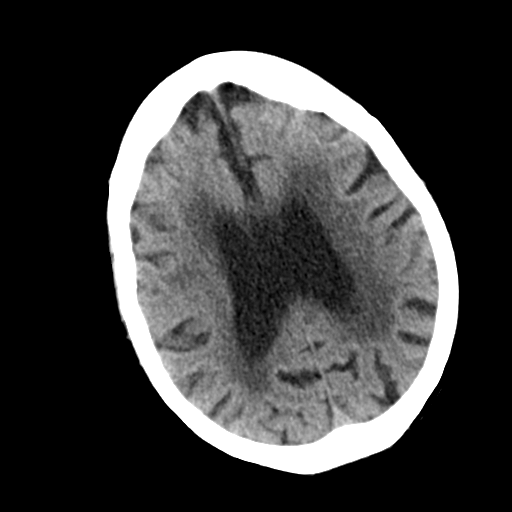
[im 20/30  brain]
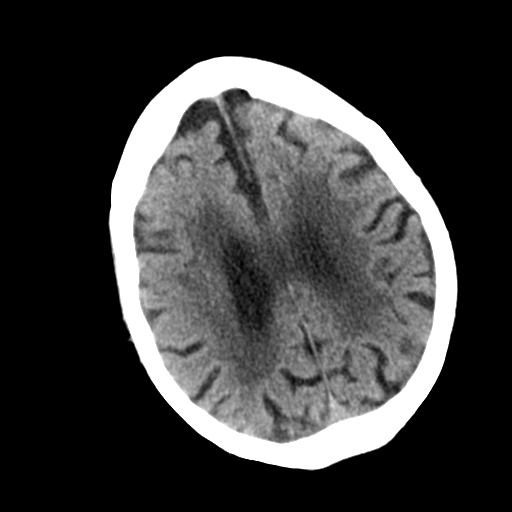
[im 20/30  bone]
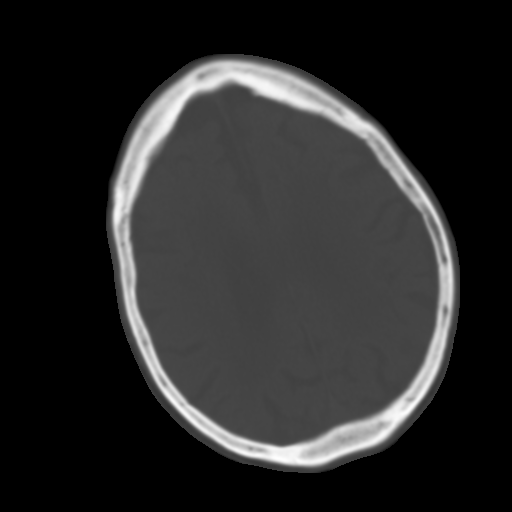
[im 22/30  brain]
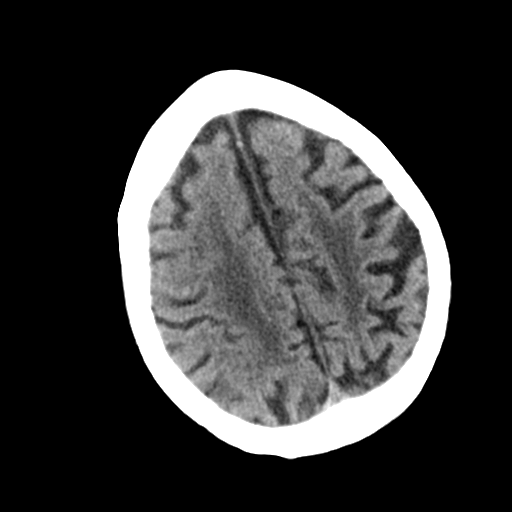
[im 26/30  brain]
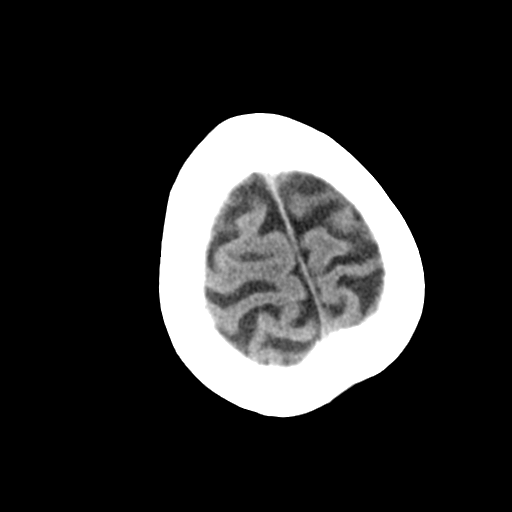
[im 28/30  brain]
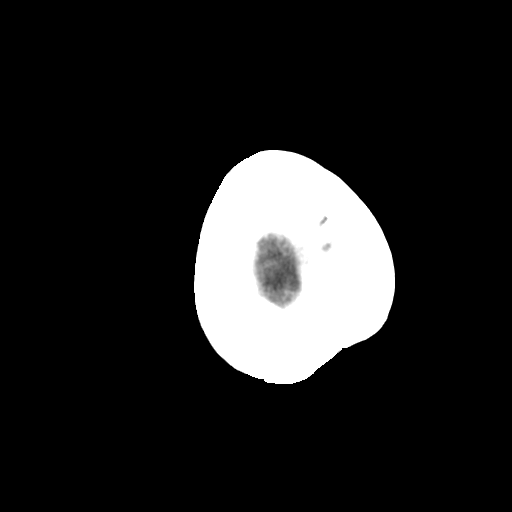

[Series 5: head 3.0 mpr cor · coronal · 0.33mm/px · 2 of 67 slices shown]
[im 23/67  brain]
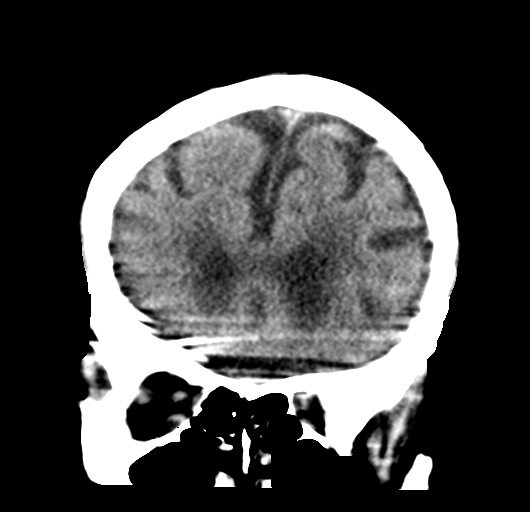
[im 45/67  brain]
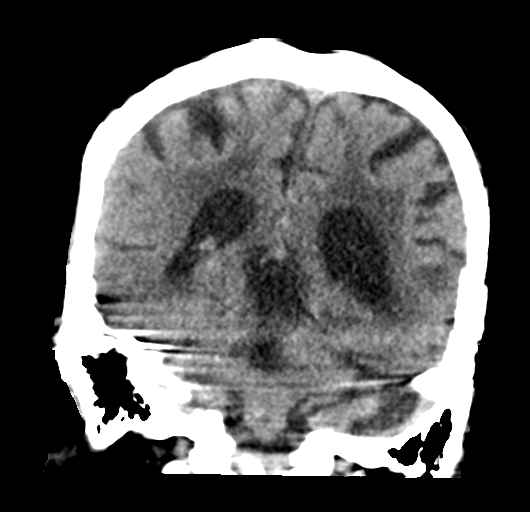

[Series 6: head 3.0 mpr sag · sagittal · 0.41mm/px · 1 of 67 slices shown]
[im 34/67  brain]
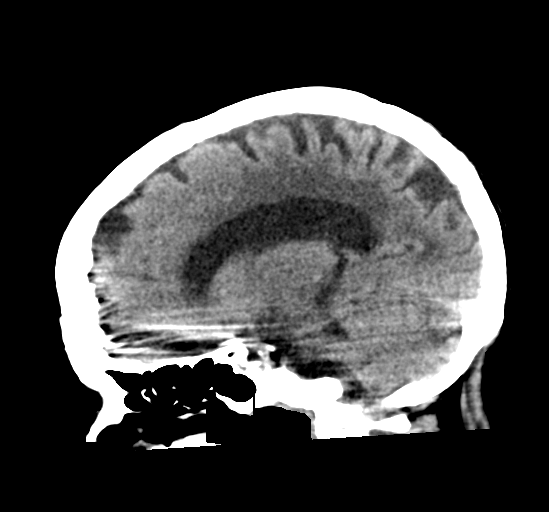

[15 of 47 positions shown; findings below may reference images not displayed]

FINDINGS: Brain: Ventricles are stable in size and configuration. Chronic
small vessel ischemic changes again noted within the bilateral
periventricular and subcortical white matter regions.

Evaluation of the brain is somewhat limited by patient motion
artifact, however, there is no mass, hemorrhage, edema or other
evidence of acute abnormality appreciated. No extra-axial hemorrhage
seen.

Vascular: Chronic calcified atherosclerotic changes of the large
vessels at the skull base. No unexpected hyperdense vessel.

Skull: Normal. Negative for fracture or focal lesion.

Sinuses/Orbits: No acute finding.

Other: None.
IMPRESSION: 1. No acute findings. No intracranial mass, hemorrhage or edema.
2. Chronic small vessel ischemic changes in the white matter.
